# Patient Record
Sex: Male | Born: 1945 | Race: White | Hispanic: No | Marital: Married | Smoking: Former smoker
Health system: Southern US, Community
[De-identification: ages and names within clinical notes are randomized; demographics above are authoritative.]

## PROBLEM LIST (undated history)

## (undated) DIAGNOSIS — G4730 Sleep apnea, unspecified: Secondary | ICD-10-CM

## (undated) DIAGNOSIS — J449 Chronic obstructive pulmonary disease, unspecified: Secondary | ICD-10-CM

## (undated) DIAGNOSIS — G4700 Insomnia, unspecified: Secondary | ICD-10-CM

## (undated) DIAGNOSIS — E119 Type 2 diabetes mellitus without complications: Secondary | ICD-10-CM

## (undated) DIAGNOSIS — Z993 Dependence on wheelchair: Secondary | ICD-10-CM

## (undated) DIAGNOSIS — M549 Dorsalgia, unspecified: Secondary | ICD-10-CM

## (undated) DIAGNOSIS — M1612 Unilateral primary osteoarthritis, left hip: Secondary | ICD-10-CM

## (undated) DIAGNOSIS — F32A Depression, unspecified: Secondary | ICD-10-CM

## (undated) DIAGNOSIS — I10 Essential (primary) hypertension: Secondary | ICD-10-CM

## (undated) DIAGNOSIS — M4306 Spondylolysis, lumbar region: Secondary | ICD-10-CM

## (undated) DIAGNOSIS — E785 Hyperlipidemia, unspecified: Secondary | ICD-10-CM

## (undated) DIAGNOSIS — M4302 Spondylolysis, cervical region: Secondary | ICD-10-CM

## (undated) DIAGNOSIS — J302 Other seasonal allergic rhinitis: Secondary | ICD-10-CM

## (undated) HISTORY — PX: BLEPHAROPLASTY: SUR158

## (undated) HISTORY — PX: UPPER GI ENDOSCOPY: SHX6162

## (undated) HISTORY — PX: COLONOSCOPY: SHX174

---

## 2001-12-03 HISTORY — PX: CARDIAC CATHETERIZATION: SHX172

## 2006-12-03 HISTORY — PX: PALATE SURGERY: SHX729

## 2013-12-03 HISTORY — PX: JOINT REPLACEMENT: SHX530

## 2019-02-02 DIAGNOSIS — R0609 Other forms of dyspnea: Secondary | ICD-10-CM | POA: Insufficient documentation

## 2019-02-02 DIAGNOSIS — M47812 Spondylosis without myelopathy or radiculopathy, cervical region: Secondary | ICD-10-CM | POA: Insufficient documentation

## 2019-02-02 DIAGNOSIS — M47816 Spondylosis without myelopathy or radiculopathy, lumbar region: Secondary | ICD-10-CM | POA: Insufficient documentation

## 2019-02-02 DIAGNOSIS — M25569 Pain in unspecified knee: Secondary | ICD-10-CM | POA: Insufficient documentation

## 2019-02-02 DIAGNOSIS — M1991 Primary osteoarthritis, unspecified site: Secondary | ICD-10-CM | POA: Insufficient documentation

## 2019-02-02 DIAGNOSIS — M25559 Pain in unspecified hip: Secondary | ICD-10-CM | POA: Insufficient documentation

## 2019-02-02 DIAGNOSIS — M179 Osteoarthritis of knee, unspecified: Secondary | ICD-10-CM | POA: Insufficient documentation

## 2019-02-02 DIAGNOSIS — F32A Depression, unspecified: Secondary | ICD-10-CM | POA: Insufficient documentation

## 2019-02-02 DIAGNOSIS — R079 Chest pain, unspecified: Secondary | ICD-10-CM | POA: Insufficient documentation

## 2019-02-02 DIAGNOSIS — M545 Low back pain, unspecified: Secondary | ICD-10-CM | POA: Insufficient documentation

## 2019-02-02 DIAGNOSIS — M542 Cervicalgia: Secondary | ICD-10-CM | POA: Insufficient documentation

## 2019-02-02 DIAGNOSIS — M79643 Pain in unspecified hand: Secondary | ICD-10-CM | POA: Insufficient documentation

## 2019-02-02 DIAGNOSIS — M1711 Unilateral primary osteoarthritis, right knee: Secondary | ICD-10-CM | POA: Insufficient documentation

## 2019-02-26 DIAGNOSIS — G4733 Obstructive sleep apnea (adult) (pediatric): Secondary | ICD-10-CM | POA: Insufficient documentation

## 2019-05-28 ENCOUNTER — Other Ambulatory Visit: Payer: Self-pay

## 2019-05-28 DIAGNOSIS — R079 Chest pain, unspecified: Secondary | ICD-10-CM

## 2019-06-10 ENCOUNTER — Other Ambulatory Visit: Payer: Self-pay

## 2019-06-10 ENCOUNTER — Ambulatory Visit (HOSPITAL_COMMUNITY)
Admission: RE | Admit: 2019-06-10 | Discharge: 2019-06-10 | Disposition: A | Discharge status: Home / Self Care | Payer: BC Managed Care – PPO | Source: Ambulatory Visit | Attending: Cardiology | Admitting: Cardiology

## 2019-06-10 ENCOUNTER — Ambulatory Visit (HOSPITAL_COMMUNITY): Payer: BC Managed Care – PPO

## 2019-06-10 DIAGNOSIS — R0789 Other chest pain: Secondary | ICD-10-CM

## 2019-06-10 DIAGNOSIS — R079 Chest pain, unspecified: Secondary | ICD-10-CM

## 2019-06-10 LAB — POCT I-STAT CREATININE: Creatinine, Ser: 0.9 mg/dL (ref 0.61–1.24)

## 2019-06-10 MED ORDER — NITROGLYCERIN 0.4 MG SL SUBL
0.8000 mg | SUBLINGUAL_TABLET | SUBLINGUAL | Status: DC | PRN
  Administered 2019-06-10: 0.8 mg via SUBLINGUAL

## 2019-06-10 MED ORDER — METOPROLOL TARTRATE 5 MG/5ML IV SOLN
5.0000 mg | INTRAVENOUS | Status: DC | PRN
  Filled 2019-06-10: qty 5

## 2019-06-10 MED ORDER — IOHEXOL 350 MG/ML SOLN
100.0000 mL | Freq: Once | INTRAVENOUS | Status: AC | PRN
  Administered 2019-06-10: 16:00:00 100 mL via INTRAVENOUS

## 2019-06-10 MED ORDER — NITROGLYCERIN 0.4 MG SL SUBL
SUBLINGUAL_TABLET | SUBLINGUAL | Status: AC
  Filled 2019-06-10: qty 2

## 2019-07-16 DIAGNOSIS — M159 Polyosteoarthritis, unspecified: Secondary | ICD-10-CM | POA: Insufficient documentation

## 2019-07-16 DIAGNOSIS — Z125 Encounter for screening for malignant neoplasm of prostate: Secondary | ICD-10-CM | POA: Insufficient documentation

## 2019-07-16 DIAGNOSIS — I10 Essential (primary) hypertension: Secondary | ICD-10-CM | POA: Insufficient documentation

## 2019-07-16 DIAGNOSIS — E119 Type 2 diabetes mellitus without complications: Secondary | ICD-10-CM | POA: Insufficient documentation

## 2021-10-24 ENCOUNTER — Ambulatory Visit: Payer: Self-pay

## 2021-10-24 ENCOUNTER — Encounter: Payer: Self-pay | Admitting: Orthopaedic Surgery

## 2021-10-24 ENCOUNTER — Ambulatory Visit: Payer: BC Managed Care – PPO | Admitting: Orthopaedic Surgery

## 2021-10-24 ENCOUNTER — Other Ambulatory Visit: Payer: Self-pay

## 2021-10-24 VITALS — Ht 66.0 in | Wt 247.8 lb

## 2021-10-24 DIAGNOSIS — M160 Bilateral primary osteoarthritis of hip: Secondary | ICD-10-CM | POA: Diagnosis not present

## 2021-10-24 DIAGNOSIS — M25552 Pain in left hip: Secondary | ICD-10-CM | POA: Diagnosis not present

## 2021-10-24 NOTE — Progress Notes (Signed)
Office Visit Note   Patient: Kent Rodriguez           Date of Birth: 1946/05/24           MRN: [id number] Visit Date: 10/24/2021              Requested by: Silverio Decamp, MD [address] [place],  [place] [address] PCP: Silverio Decamp, MD   Assessment & Plan: Visit Diagnoses:  1. Bilateral primary osteoarthritis of hip     Plan: Impression is advanced degenerative joint disease both hips left greater than right.  The patient has been dealing with this pain for several years and has failed conservative management to include intra-articular cortisone injections and narcotic pain medication.  He would like to proceed with total hip arthroplasty and would like the left hip replaced first.  Risk, benefits and alternatives reviewed.  Rehab recovery time discussed.  All questions were answered.  His last hemoglobin A1c was 6.4 5 months ago so we will go ahead and obtain a new 1 today.  Will obtain CT scan for preoperative planning.  Will have patient follow up after CT scan.  I encouraged him to continue all efforts at weight loss to help with postoperative rehab and decrease perioperative risk.    Follow-Up Instructions: Return for after left hip ct scan.   Orders:  Orders Placed This Encounter  Procedures   XR HIP UNILAT W OR W/O PELVIS 2-3 VIEWS LEFT   XR HIP UNILAT W OR W/O PELVIS 2-3 VIEWS RIGHT   No orders of the defined types were placed in this encounter.     Procedures: No procedures performed   Clinical Data: No additional findings.   Subjective: Chief Complaint  Patient presents with   Right Hip - Pain   Left Hip - Pain    HPI patient is a pleasant 75 year old gentleman who comes in today with bilateral hip pain left greater than right.  This is been ongoing for the past 3 to 4 years.  The pain is to the groin and occasionally radiates into the knee.  Pain is worse with ambulation.  He has been taking morphine which she gets from pain management.   He has had a few cortisone injections in the past which no longer relieves his symptoms.  He has been ambulating in an electric wheelchair for the past year due to the significant pain.  Review of Systems as detailed in HPI.  All others reviewed and are negative.   Objective: Vital Signs: Ht 5\' 6"  (1.676 m)   Wt 247 lb 12.8 oz (112.4 kg)   BMI 40.00 kg/m   Physical Exam well-developed well-nourished gentleman in no acute distress.  Alert and oriented x3.  Ortho Exam bilateral hip exam shows markedly positive logroll both sides.  Positive FADIR.  He is neurovascular intact distally.  He is able to stand and transfer on his legs but cannot take more than 2 steps due to severe pain.    Specialty Comments:  No specialty comments available.  Imaging: XR HIP UNILAT W OR W/O PELVIS 2-3 VIEWS LEFT  Result Date: 10/24/2021 X-rays demonstrate significant joint space narrowing with questionable protrusio.  XR HIP UNILAT W OR W/O PELVIS 2-3 VIEWS RIGHT  Result Date: 10/24/2021 X-rays demonstrate significant joint space narrowing    PMFS History: There are no problems to display for this patient.  History reviewed. No pertinent past medical history.  History reviewed. No pertinent family history.  History  reviewed. No pertinent surgical history. Social History   Occupational History   Not on file  Tobacco Use   Smoking status: Not on file   Smokeless tobacco: Not on file  Substance and Sexual Activity   Alcohol use: Not on file   Drug use: Not on file   Sexual activity: Not on file

## 2021-10-25 LAB — PREALBUMIN: Prealbumin: 26 mg/dL (ref 21–43)

## 2021-10-25 LAB — HEMOGLOBIN A1C
Hgb A1c MFr Bld: 5.7 % of total Hgb — ABNORMAL HIGH (ref <5.7)
Mean Plasma Glucose: 117 mg/dL
eAG (mmol/L): 6.5 mmol/L

## 2021-11-06 DIAGNOSIS — E7800 Pure hypercholesterolemia, unspecified: Secondary | ICD-10-CM | POA: Insufficient documentation

## 2021-11-06 DIAGNOSIS — G4700 Insomnia, unspecified: Secondary | ICD-10-CM | POA: Insufficient documentation

## 2021-11-16 ENCOUNTER — Ambulatory Visit
Admission: RE | Admit: 2021-11-16 | Discharge: 2021-11-16 | Disposition: A | Discharge status: Home / Self Care | Payer: BC Managed Care – PPO | Source: Ambulatory Visit | Attending: Orthopaedic Surgery | Admitting: Orthopaedic Surgery

## 2021-11-16 DIAGNOSIS — M25552 Pain in left hip: Secondary | ICD-10-CM

## 2021-11-21 ENCOUNTER — Other Ambulatory Visit: Payer: Self-pay

## 2021-11-21 ENCOUNTER — Ambulatory Visit: Payer: BC Managed Care – PPO | Admitting: Orthopaedic Surgery

## 2021-11-21 DIAGNOSIS — M1612 Unilateral primary osteoarthritis, left hip: Secondary | ICD-10-CM

## 2021-11-21 NOTE — Progress Notes (Signed)
° °  Office Visit Note   Patient: Kent Rodriguez           Date of Birth: [birth year]           MRN: [id number] Visit Date: 11/21/2021              Requested by: Gaspar Skeeters, MD [id number] [place],  [place] [address] PCP: Gaspar Skeeters, MD   Assessment & Plan: Visit Diagnoses:  1. Primary osteoarthritis of left hip     Plan: Patient returns today for review of left hip CT scan.  This was done to evaluate the severity of his DJD.  Left hip exam is unchanged.  CT scan reviewed with the patient and we went over details of the surgery and the associated risk benefits rehab recovery.  At this point we will go ahead and have Debbie call the patient in the near future to arrange surgery.  We will plan to place an incisional VAC due to overhanging pannus.  Follow-Up Instructions: No follow-ups on file.   Orders:  No orders of the defined types were placed in this encounter.  No orders of the defined types were placed in this encounter.     Procedures: No procedures performed   Clinical Data: No additional findings.   Subjective: Chief Complaint  Patient presents with   Left Hip - Follow-up    HPI  Review of Systems   Objective: Vital Signs: There were no vitals taken for this visit.  Physical Exam  Ortho Exam  Specialty Comments:  No specialty comments available.  Imaging: No results found.   PMFS History: Patient Active Problem List   Diagnosis Date Noted   High blood cholesterol 11/06/2021   Insomnia 11/06/2021   Diabetes mellitus without complication (HCC) 07/16/2019   Essential hypertension 07/16/2019   Primary osteoarthritis involving multiple joints 07/16/2019   Screening for prostate cancer 07/16/2019   Obstructive sleep apnea (adult) (pediatric) 02/26/2019   Morbid (severe) obesity due to excess calories (HCC) 02/26/2019   Cervical spondylosis 02/02/2019   Lumbar spondylosis 02/02/2019   Depression, unspecified 02/02/2019    Dyspnea on exertion 02/02/2019   Hand pain 02/02/2019   Chest pain with high risk for cardiac etiology 02/02/2019   Hip pain 02/02/2019   Knee pain 02/02/2019   Localized, primary osteoarthritis 02/02/2019   Low back pain 02/02/2019   Neck pain 02/02/2019   Osteoarthritis of knee 02/02/2019   No past medical history on file.  No family history on file.  No past surgical history on file. Social History   Occupational History   Not on file  Tobacco Use   Smoking status: Not on file   Smokeless tobacco: Not on file  Substance and Sexual Activity   Alcohol use: Not on file   Drug use: Not on file   Sexual activity: Not on file

## 2021-12-10 ENCOUNTER — Encounter: Payer: Self-pay | Admitting: Orthopaedic Surgery

## 2021-12-22 ENCOUNTER — Other Ambulatory Visit: Payer: Self-pay

## 2022-01-12 ENCOUNTER — Encounter: Payer: Self-pay | Admitting: Orthopaedic Surgery

## 2022-01-15 ENCOUNTER — Other Ambulatory Visit: Payer: Self-pay | Admitting: Physician Assistant

## 2022-01-15 MED ORDER — METHOCARBAMOL 500 MG PO TABS: 500.0000 mg | ORAL_TABLET | Freq: Two times a day (BID) | ORAL | 0 refills | Status: DC | PRN

## 2022-01-15 MED ORDER — DOCUSATE SODIUM 100 MG PO CAPS: 100.0000 mg | ORAL_CAPSULE | Freq: Every day | ORAL | 2 refills | Status: AC | PRN

## 2022-01-15 MED ORDER — SULFAMETHOXAZOLE-TRIMETHOPRIM 800-160 MG PO TABS: 1.0000 | ORAL_TABLET | Freq: Two times a day (BID) | ORAL | 0 refills | Status: DC

## 2022-01-15 MED ORDER — ASPIRIN EC 81 MG PO TBEC: 81.0000 mg | DELAYED_RELEASE_TABLET | Freq: Two times a day (BID) | ORAL | 0 refills | Status: DC

## 2022-01-15 MED ORDER — ONDANSETRON HCL 4 MG PO TABS: 4.0000 mg | ORAL_TABLET | Freq: Three times a day (TID) | ORAL | 0 refills | Status: DC | PRN

## 2022-01-18 NOTE — Pre-Procedure Instructions (Signed)
Surgical Instructions    Your procedure is scheduled on Monday, February 20th.  Report to [facility] Main Entrance "A" at 12:15 P.M., then check in with the Admitting office.  Call this number if you have problems the morning of surgery:  [phone]   If you have any questions prior to your surgery date call [phone]: Open Monday-Friday 8am-4pm    Remember:  Do not eat after midnight the night before your surgery  You may drink clear liquids until 11:15 AM the morning of your surgery.   Clear liquids allowed are: Water, Non-Citrus Juices (without pulp), Carbonated Beverages, Clear Tea, Black Coffee Only (NO MILK, CREAM OR POWDERED CREAMER of any kind), and Gatorade.   Patient Instructions  The night before surgery:  No food after midnight. ONLY clear liquids after midnight   The day of surgery (if you have diabetes): Drink ONE (1) 12 oz G2 given to you in your pre admission testing appointment by 11:15 AM the morning of surgery. Drink in one sitting. Do not sip.  This drink was given to you during your hospital  pre-op appointment visit.  Nothing else to drink after completing the  12 oz bottle of G2.         If you have questions, please contact your surgeons office.      Take these medicines the morning of surgery with A SIP OF WATER  metoprolol succinate (TOPROL-XL)  morphine (MS CONTIN)    If needed: fluticasone (FLONASE)  methocarbamol (ROBAXIN) morphine (MSIR) ondansetron (ZOFRAN)     As of today, STOP taking any Aspirin (unless otherwise instructed by your surgeon) Aleve, Naproxen, Ibuprofen, Motrin, Advil, Goody's, BC's, all herbal medications, fish oil, and all vitamins. This includes your meloxicam (MOBIC).    WHAT DO I DO ABOUT MY DIABETES MEDICATION?   Do not take metFORMIN (GLUCOPHAGE-XR) the morning of surgery.     HOW TO MANAGE YOUR DIABETES BEFORE AND AFTER SURGERY  Why is it important to control my blood sugar before and after  surgery? Improving blood sugar levels before and after surgery helps healing and can limit problems. A way of improving blood sugar control is eating a healthy diet by:  Eating less sugar and carbohydrates  Increasing activity/exercise  Talking with your doctor about reaching your blood sugar goals High blood sugars (greater than 180 mg/dL) can raise your risk of infections and slow your recovery, so you will need to focus on controlling your diabetes during the weeks before surgery. Make sure that the doctor who takes care of your diabetes knows about your planned surgery including the date and location.  How do I manage my blood sugar before surgery? Check your blood sugar at least 4 times a day, starting 2 days before surgery, to make sure that the level is not too high or low.  Check your blood sugar the morning of your surgery when you wake up and every 2 hours until you get to the Short Stay unit.  If your blood sugar is less than 70 mg/dL, you will need to treat for low blood sugar: Do not take insulin. Treat a low blood sugar (less than 70 mg/dL) with  cup of clear juice (cranberry or apple), 4 glucose tablets, OR glucose gel. Recheck blood sugar in 15 minutes after treatment (to make sure it is greater than 70 mg/dL). If your blood sugar is not greater than 70 mg/dL on recheck, call [phone] for further instructions. Report your blood sugar to the short  stay nurse when you get to Short Stay.  If you are admitted to the hospital after surgery: Your blood sugar will be checked by the staff and you will probably be given insulin after surgery (instead of oral diabetes medicines) to make sure you have good blood sugar levels. The goal for blood sugar control after surgery is 80-180 mg/dL.                     Do NOT Smoke (Tobacco/Vaping) for 24 hours prior to your procedure.  If you use a CPAP at night, you may bring your mask/headgear for your overnight stay.   Contacts,  glasses, piercing's, hearing aid's, dentures or partials may not be worn into surgery, please bring cases for these belongings.    For patients admitted to the hospital, discharge time will be determined by your treatment team.   Patients discharged the day of surgery will not be allowed to drive home, and someone needs to stay with them for 24 hours.  NO VISITORS WILL BE ALLOWED IN PRE-OP WHERE PATIENTS ARE PREPPED FOR SURGERY.  ONLY 1 SUPPORT PERSON MAY BE PRESENT IN THE WAITING ROOM WHILE YOU ARE IN SURGERY.  IF YOU ARE TO BE ADMITTED, ONCE YOU ARE IN YOUR ROOM YOU WILL BE ALLOWED TWO (2) VISITORS. (1) VISITOR MAY STAY OVERNIGHT BUT MUST ARRIVE TO THE ROOM BY 8pm.  Minor children may have two parents present. Special consideration for safety and communication needs will be reviewed on a case by case basis.   Special instructions:   - Preparing For Surgery  Before surgery, you can play an important role. Because skin is not sterile, your skin needs to be as free of germs as possible. You can reduce the number of germs on your skin by washing with CHG (chlorahexidine gluconate) Soap before surgery.  CHG is an antiseptic cleaner which kills germs and bonds with the skin to continue killing germs even after washing.    Oral Hygiene is also important to reduce your risk of infection.  Remember - BRUSH YOUR TEETH THE MORNING OF SURGERY WITH YOUR REGULAR TOOTHPASTE  Please do not use if you have an allergy to CHG or antibacterial soaps. If your skin becomes reddened/irritated stop using the CHG.  Do not shave (including legs and underarms) for at least 48 hours prior to first CHG shower. It is OK to shave your face.  Please follow these instructions carefully.   Shower the NIGHT BEFORE SURGERY and the MORNING OF SURGERY  If you chose to wash your hair, wash your hair first as usual with your normal shampoo.  After you shampoo, rinse your hair and body thoroughly to remove the  shampoo.  Use CHG Soap as you would any other liquid soap. You can apply CHG directly to the skin and wash gently with a scrungie or a clean washcloth.   Apply the CHG Soap to your body ONLY FROM THE NECK DOWN.  Do not use on open wounds or open sores. Avoid contact with your eyes, ears, mouth and genitals (private parts). Wash Face and genitals (private parts)  with your normal soap.   Wash thoroughly, paying special attention to the area where your surgery will be performed.  Thoroughly rinse your body with warm water from the neck down.  DO NOT shower/wash with your normal soap after using and rinsing off the CHG Soap.  Pat yourself dry with a CLEAN TOWEL.  Wear CLEAN PAJAMAS to bed  the night before surgery  Place CLEAN SHEETS on your bed the night before your surgery  DO NOT SLEEP WITH PETS.   Day of Surgery: Shower with CHG soap. Do not wear jewelry Do not wear lotions, powders, colognes, or deodorant. Do not shave 48 hours prior to surgery.  Men may shave face and neck. Do not bring valuables to the hospital. [facility] is not responsible for any belongings or valuables. Wear Clean/Comfortable clothing the morning of surgery Remember to brush your teeth WITH YOUR REGULAR TOOTHPASTE.   Please read over the following fact sheets that you were given.   3 days prior to your procedure or After your COVID test   You are not required to quarantine however you are required to wear a well-fitting mask when you are out and around people not in your household. If your mask becomes wet or soiled, replace with a new one.   Wash your hands often with soap and water for 20 seconds or clean your hands with an alcohol-based hand sanitizer that contains at least 60% alcohol.   Do not share personal items.   Notify your provider:  o if you are in close contact with someone who has COVID  o or if you develop a fever of 100.4 or greater, sneezing, cough, sore throat, shortness of  breath or body aches.

## 2022-01-19 ENCOUNTER — Encounter (HOSPITAL_COMMUNITY)
Admission: RE | Admit: 2022-01-19 | Discharge: 2022-01-19 | Disposition: A | Discharge status: Home / Self Care | Payer: BC Managed Care – PPO | Source: Ambulatory Visit | Attending: Orthopaedic Surgery | Admitting: Orthopaedic Surgery

## 2022-01-19 ENCOUNTER — Encounter (HOSPITAL_COMMUNITY): Payer: Self-pay

## 2022-01-19 ENCOUNTER — Other Ambulatory Visit: Payer: Self-pay

## 2022-01-19 VITALS — BP 130/77 | HR 62 | Temp 98.7°F | Resp 18 | Ht 66.0 in | Wt 240.0 lb

## 2022-01-19 DIAGNOSIS — I2510 Atherosclerotic heart disease of native coronary artery without angina pectoris: Secondary | ICD-10-CM | POA: Diagnosis not present

## 2022-01-19 DIAGNOSIS — M1612 Unilateral primary osteoarthritis, left hip: Secondary | ICD-10-CM | POA: Insufficient documentation

## 2022-01-19 DIAGNOSIS — G4733 Obstructive sleep apnea (adult) (pediatric): Secondary | ICD-10-CM | POA: Diagnosis not present

## 2022-01-19 DIAGNOSIS — J449 Chronic obstructive pulmonary disease, unspecified: Secondary | ICD-10-CM | POA: Insufficient documentation

## 2022-01-19 DIAGNOSIS — Z20822 Contact with and (suspected) exposure to covid-19: Secondary | ICD-10-CM | POA: Insufficient documentation

## 2022-01-19 DIAGNOSIS — E119 Type 2 diabetes mellitus without complications: Secondary | ICD-10-CM | POA: Diagnosis not present

## 2022-01-19 DIAGNOSIS — M545 Low back pain, unspecified: Secondary | ICD-10-CM | POA: Diagnosis not present

## 2022-01-19 DIAGNOSIS — E785 Hyperlipidemia, unspecified: Secondary | ICD-10-CM | POA: Diagnosis not present

## 2022-01-19 DIAGNOSIS — G4700 Insomnia, unspecified: Secondary | ICD-10-CM | POA: Diagnosis not present

## 2022-01-19 DIAGNOSIS — I10 Essential (primary) hypertension: Secondary | ICD-10-CM | POA: Diagnosis not present

## 2022-01-19 DIAGNOSIS — Z9989 Dependence on other enabling machines and devices: Secondary | ICD-10-CM | POA: Insufficient documentation

## 2022-01-19 DIAGNOSIS — Z87891 Personal history of nicotine dependence: Secondary | ICD-10-CM | POA: Insufficient documentation

## 2022-01-19 DIAGNOSIS — Z01818 Encounter for other preprocedural examination: Secondary | ICD-10-CM | POA: Insufficient documentation

## 2022-01-19 DIAGNOSIS — Z993 Dependence on wheelchair: Secondary | ICD-10-CM | POA: Insufficient documentation

## 2022-01-19 HISTORY — DX: Essential (primary) hypertension: I10

## 2022-01-19 HISTORY — DX: Chronic obstructive pulmonary disease, unspecified: J44.9

## 2022-01-19 HISTORY — DX: Dependence on wheelchair: Z99.3

## 2022-01-19 HISTORY — DX: Dorsalgia, unspecified: M54.9

## 2022-01-19 HISTORY — DX: Spondylolysis, lumbar region: M43.06

## 2022-01-19 HISTORY — DX: Type 2 diabetes mellitus without complications: E11.9

## 2022-01-19 HISTORY — DX: Other seasonal allergic rhinitis: J30.2

## 2022-01-19 HISTORY — DX: Hyperlipidemia, unspecified: E78.5

## 2022-01-19 HISTORY — DX: Sleep apnea, unspecified: G47.30

## 2022-01-19 HISTORY — DX: Unilateral primary osteoarthritis, left hip: M16.12

## 2022-01-19 HISTORY — DX: Depression, unspecified: F32.A

## 2022-01-19 HISTORY — DX: Spondylolysis, cervical region: M43.02

## 2022-01-19 HISTORY — DX: Insomnia, unspecified: G47.00

## 2022-01-19 LAB — SURGICAL PCR SCREEN
MRSA, PCR: NEGATIVE
Staphylococcus aureus: NEGATIVE

## 2022-01-19 LAB — SARS CORONAVIRUS 2 (TAT 6-24 HRS): SARS Coronavirus 2: NEGATIVE

## 2022-01-19 LAB — BASIC METABOLIC PANEL
Anion gap: 8 (ref 5–15)
BUN: 29 mg/dL — ABNORMAL HIGH (ref 8–23)
CO2: 29 mmol/L (ref 22–32)
Calcium: 9.4 mg/dL (ref 8.9–10.3)
Chloride: 103 mmol/L (ref 98–111)
Creatinine, Ser: 1 mg/dL (ref 0.61–1.24)
GFR, Estimated: >60 mL/min (ref >60)
Glucose, Bld: 120 mg/dL — ABNORMAL HIGH (ref 70–99)
Potassium: 4.1 mmol/L (ref 3.5–5.1)
Sodium: 140 mmol/L (ref 135–145)

## 2022-01-19 LAB — CBC
HCT: 45.3 % (ref 39.0–52.0)
Hemoglobin: 15.2 g/dL (ref 13.0–17.0)
MCH: 31.2 pg (ref 26.0–34.0)
MCHC: 33.6 g/dL (ref 30.0–36.0)
MCV: 93 fL (ref 80.0–100.0)
Platelets: 258 10*3/uL (ref 150–400)
RBC: 4.87 MIL/uL (ref 4.22–5.81)
RDW: 13.2 % (ref 11.5–15.5)
WBC: 6.9 10*3/uL (ref 4.0–10.5)
nRBC: 0 % (ref 0.0–0.2)

## 2022-01-19 LAB — GLUCOSE, CAPILLARY: Glucose-Capillary: 116 mg/dL — ABNORMAL HIGH (ref 70–99)

## 2022-01-19 MED ORDER — TRANEXAMIC ACID 1000 MG/10ML IV SOLN
2000.0000 mg | INTRAVENOUS | Status: DC
  Filled 2022-01-19: qty 20

## 2022-01-19 NOTE — Anesthesia Preprocedure Evaluation (Addendum)
Anesthesia Evaluation  Patient identified by MRN, date of birth, ID band Patient awake    Reviewed: Allergy & Precautions, NPO status , Patient's Chart, lab work & pertinent test results  Airway Mallampati: II  TM Distance: >3 FB Neck ROM: Full    Dental no notable dental hx.    Pulmonary sleep apnea and Continuous Positive Airway Pressure Ventilation , COPD, former smoker,    Pulmonary exam normal breath sounds clear to auscultation       Cardiovascular hypertension, Pt. on medications Normal cardiovascular exam Rhythm:Regular Rate:Normal  ECG: NSR, rate 60   Neuro/Psych PSYCHIATRIC DISORDERS Depression negative neurological ROS     GI/Hepatic negative GI ROS, (+)     substance abuse  ,   Endo/Other  diabetes, Oral Hypoglycemic Agents  Renal/GU negative Renal ROS     Musculoskeletal  (+) Arthritis , narcotic dependentWheelchair bound   Abdominal (+) + obese,   Peds  Hematology negative hematology ROS (+)   Anesthesia Other Findings LEFT HIP DEGENERATIVE JOINT DISEASE  Reproductive/Obstetrics                          Anesthesia Physical Anesthesia Plan  ASA: 3  Anesthesia Plan: Spinal   Post-op Pain Management:    Induction: Intravenous  PONV Risk Score and Plan: 1 and Ondansetron, Dexamethasone, Midazolam, Propofol infusion and Treatment may vary due to age or medical condition  Airway Management Planned: Simple Face Mask  Additional Equipment:   Intra-op Plan:   Post-operative Plan:   Informed Consent: I have reviewed the patients History and Physical, chart, labs and discussed the procedure including the risks, benefits and alternatives for the proposed anesthesia with the patient or authorized representative who has indicated his/her understanding and acceptance.     Dental advisory given  Plan Discussed with: CRNA  Anesthesia Plan Comments: (Reviewed PAT note  written 01/19/2022 by Shonna Chock, PA-C. )       Anesthesia Quick Evaluation

## 2022-01-19 NOTE — Progress Notes (Signed)
DUE TO COVID-19 ONLY ONE VISITOR IS ALLOWED TO COME WITH YOU AND STAY IN THE WAITING ROOM ONLY DURING PRE OP AND PROCEDURE DAY OF SURGERY.   Two VISITORS MAY VISIT WITH YOU AFTER SURGERY IN YOUR PRIVATE ROOM DURING VISITING HOURS ONLY!  PCP - Dr Gaspar Skeeters Cardiologist - n/a  Chest x-ray - n/a EKG - 01/19/22 Stress Test - n/a ECHO - 02/19/19 Cardiac Cath - n/a  ICD Pacemaker/Loop - n/a  Sleep Study -  Yes CPAP - uses CPAP nightly  Do not take Metformin on the morning of surgery.  If your blood sugar is less than 70 mg/dL, you will need to treat for low blood sugar: Treat a low blood sugar (less than 70 mg/dL) with  cup of clear juice (cranberry or apple), 4 glucose tablets, OR glucose gel. Recheck blood sugar in 15 minutes after treatment (to make sure it is greater than 70 mg/dL). If your blood sugar is not greater than 70 mg/dL on recheck, call [phone] for further instructions.  Aspirin Instructions: Aspirin was prescribed for after surgery.  Patient does not currently taking ASA.  ERAS: Clear liquids til 11:15 AM.  Complete G2 drink by 11:15 AM DOS.  Do not drink anything else after G2 drink on DOS.  Detail instructions on patient's instructions for DOS.  Anesthesia review: Yes  STOP now taking any Aspirin (unless otherwise instructed by your surgeon), Aleve, Naproxen, Ibuprofen, Motrin, Advil, Goody's, BC's, all herbal medications, fish oil, and all vitamins.   Coronavirus Screening Covid test is scheduled on 01/19/22 at PAT appt. Do you have any of the following symptoms:  Cough yes/no: No Fever (>100.58F)  yes/no: No Runny nose yes/no: No Sore throat yes/no: No Difficulty breathing/shortness of breath  yes/no: No  Have you traveled in the last 14 days and where? yes/no: No  Patient verbalized understanding of instructions that were given to them at the PAT appointment. Patient was also instructed that they will need to review over the PAT instructions again at  home before surgery.

## 2022-01-19 NOTE — Progress Notes (Signed)
Anesthesia Chart Review:  Case: [id number] Date/Time: 01/22/22 1155   Procedure: LEFT TOTAL HIP ARTHROPLASTY ANTERIOR APPROACH (Left: Hip) - 3-C   Anesthesia type: Spinal   Pre-op diagnosis: LEFT HIP DEGENERATIVE JOINT DISEASE   Location: [facility] OR ROOM 05 / [place] OR   Surgeons: Leandrew Koyanagi, MD       DISCUSSION: Patient is a 76 year old male scheduled for the above procedure.  History includes former smoker (quit 08/24/91), HTN, HLD, DM2, OSA (uses CPAP), COPD, low back pain (uses wheelchair), insomnia. 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  He had preoperative evaluation on 01/16/22 by Leone Haven, MD (see [place]). Note states, "May proceed with surgery as planned."  01/19/2022 presurgical COVID-19 test in process.  Anesthesia team to evaluate on the day of surgery.   VS: BP 130/77    Pulse 62    Temp 37.1 C (Oral)    Resp 18    Ht 5\' 6"  (1.676 m)    Wt 108.9 kg    SpO2 98%    BMI 38.74 kg/m    PROVIDERS: Leone Haven, MD is PCP  - He was evaluated by cardiologist Isaias Cowman, MD in 2020 for chest pain and SOB. 02/2019 echo showed normal LVF, EF > 55%, mild LVH, normal RVSF, mild-moderate TR, mild MR. CCTA showed coronary calcium score of 10, mild CAD with short intramyocardial bridge in the mid LAD.   LABS: Labs reviewed: Acceptable for surgery. A1c 6.4% on 11/15/21 ([facility]) (all labs ordered are listed, but only abnormal results are displayed)  Labs Reviewed  GLUCOSE, CAPILLARY - Abnormal; Notable for the following components:      Result Value   Glucose-Capillary 116 (*)    All other components within normal limits  BASIC METABOLIC PANEL - Abnormal; Notable for the following components:   Glucose, Bld 120 (*)    BUN 29 (*)    All other components within normal limits  SURGICAL PCR SCREEN  SARS CORONAVIRUS 2 (TAT 6-24 HRS)  CBC     IMAGES: CT Left Hip 11/16/21: IMPRESSION: 1. No acute osseous injury of the left hip. 2.  Severe osteoarthritis of the left hip. 3. Mild osteoarthritis of bilateral SI joints. 4. Partially visualized, lower lumbar spine spondylosis.   EKG: 01/19/22: Normal sinus rhythm Low voltage QRS Cannot rule out Anterior infarct , age undetermined Abnormal ECG   CV: CT coronary 06/10/19: - Aorta:  Normal size.  No calcifications.  No dissection. - Aortic Valve:  Trileaflet.  Trivial calcifications. - Coronary Arteries:  Normal coronary origin. Left dominance. -  Left main is a large artery that gives rise to LAD and LCX arteries. Left main has no plaque. - LAD is a large vessel that has one diagonal artery and no plaque. Mid LAD has a short intramyocardial bridge. - LCX is a large dominant artery that gives rise to one large OM1 branch, PDA and PLA. There is mild calcified plaque with stenosis 25-49%. - RCA is a small non-dominant artery. - Other findings: Normal pulmonary vein drainage into the left atrium. Normal left atrial appendage without a thrombus.  IMPRESSION: 1. Coronary calcium score of 10. This was 36 percentile for age and sex matched control. 2. Normal coronary origin with right dominance. 3. Mild non-obstructive CAD. There is a short intramyocardial bridge in the mid LAD. 4. 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    Echo 02/19/19 ([facility]): INTERPRETATION  NORMAL LEFT VENTRICULAR SYSTOLIC FUNCTION WITH AN ESTIMATED EF = >  55 %  NORMAL RIGHT VENTRICULAR SYSTOLIC FUNCTION  MILD-TO-MODERATE TRICUSPID VALVE INSUFFICIENCY  MILD MITRAL VALVE INSUFFICIENCY  NO VALVULAR STENOSIS  MILD LVH    Per [facility] Cardiology notes, "The patient has a history of prior cardiac catheterization in 2003 at the [facility] in [place], which apparently revealed insignificant coronary artery disease."   Past Medical History:  Diagnosis Date   Back pain    lower back   COPD (chronic obstructive pulmonary disease) ([place])    Dependent on wheelchair     Depression    Diabetes mellitus without complication (HCC)    type 2   HLD (hyperlipidemia)    Hypertension    Insomnia    Osteoarthritis of left hip    Seasonal allergies    Sleep apnea    uses CPAP nightly   Spondylolysis of cervical region    Spondylolysis of lumbar region     Past Surgical History:  Procedure Laterality Date   COLONOSCOPY     several x 3   JOINT REPLACEMENT  2015   left knee   PALATE SURGERY  2008   benign   UPPER GI ENDOSCOPY     several - stomach ulcers    MEDICATIONS:  aspirin EC 81 MG tablet   docusate sodium (COLACE) 100 MG capsule   methocarbamol (ROBAXIN) 500 MG tablet   ondansetron (ZOFRAN) 4 MG tablet   sulfamethoxazole-trimethoprim (BACTRIM DS) 800-160 MG tablet   cholecalciferol (VITAMIN D3) 25 MCG (1000 UNIT) tablet   fluticasone (FLONASE) 50 MCG/ACT nasal spray   lisinopril-hydrochlorothiazide (ZESTORETIC) 10-12.5 MG tablet   meloxicam (MOBIC) 15 MG tablet   metFORMIN (GLUCOPHAGE-XR) 500 MG 24 hr tablet   metoprolol succinate (TOPROL-XL) 50 MG 24 hr tablet   morphine (MS CONTIN) 30 MG 12 hr tablet   morphine (MSIR) 15 MG tablet   naloxone (NARCAN) nasal spray 4 mg/0.1 mL   nystatin-triamcinolone (MYCOLOG II) cream   Omega-3 Fatty Acids (FISH OIL) 1200 MG CAPS   simvastatin (ZOCOR) 20 MG tablet   traZODone (DESYREL) 50 MG tablet   zolpidem (AMBIEN) 10 MG tablet   No current facility-administered medications for this encounter.  Advised to follow surgeon recommendations regarding ASA.   Myra Gianotti, PA-C Surgical Short Stay/Anesthesiology [facility] Phone [phone] [facility] Phone [phone] 01/19/2022 1:36 PM

## 2022-01-22 ENCOUNTER — Encounter (HOSPITAL_COMMUNITY): Payer: Self-pay | Admitting: Orthopaedic Surgery

## 2022-01-22 ENCOUNTER — Ambulatory Visit (HOSPITAL_COMMUNITY): Payer: BC Managed Care – PPO

## 2022-01-22 ENCOUNTER — Other Ambulatory Visit: Payer: Self-pay

## 2022-01-22 ENCOUNTER — Ambulatory Visit (HOSPITAL_COMMUNITY): Payer: BC Managed Care – PPO | Admitting: Anesthesiology

## 2022-01-22 ENCOUNTER — Observation Stay (HOSPITAL_COMMUNITY): Payer: BC Managed Care – PPO

## 2022-01-22 ENCOUNTER — Encounter (HOSPITAL_COMMUNITY)
Admission: RE | Disposition: A | Discharge status: Home / Self Care | Payer: Self-pay | Source: Ambulatory Visit | Attending: Orthopaedic Surgery

## 2022-01-22 ENCOUNTER — Observation Stay (HOSPITAL_COMMUNITY)
Admission: RE | Admit: 2022-01-22 | Discharge: 2022-01-24 | Disposition: A | Discharge status: Home / Self Care | Payer: BC Managed Care – PPO | Source: Ambulatory Visit | Attending: Orthopaedic Surgery | Admitting: Orthopaedic Surgery

## 2022-01-22 ENCOUNTER — Ambulatory Visit (HOSPITAL_COMMUNITY): Payer: BC Managed Care – PPO | Admitting: Vascular Surgery

## 2022-01-22 DIAGNOSIS — I10 Essential (primary) hypertension: Secondary | ICD-10-CM | POA: Diagnosis not present

## 2022-01-22 DIAGNOSIS — Z79899 Other long term (current) drug therapy: Secondary | ICD-10-CM | POA: Insufficient documentation

## 2022-01-22 DIAGNOSIS — Z7984 Long term (current) use of oral hypoglycemic drugs: Secondary | ICD-10-CM | POA: Insufficient documentation

## 2022-01-22 DIAGNOSIS — J449 Chronic obstructive pulmonary disease, unspecified: Secondary | ICD-10-CM | POA: Diagnosis not present

## 2022-01-22 DIAGNOSIS — E119 Type 2 diabetes mellitus without complications: Secondary | ICD-10-CM | POA: Insufficient documentation

## 2022-01-22 DIAGNOSIS — Z96642 Presence of left artificial hip joint: Secondary | ICD-10-CM

## 2022-01-22 DIAGNOSIS — Z96649 Presence of unspecified artificial hip joint: Secondary | ICD-10-CM

## 2022-01-22 DIAGNOSIS — Z7982 Long term (current) use of aspirin: Secondary | ICD-10-CM | POA: Insufficient documentation

## 2022-01-22 DIAGNOSIS — Z419 Encounter for procedure for purposes other than remedying health state, unspecified: Secondary | ICD-10-CM

## 2022-01-22 DIAGNOSIS — M1612 Unilateral primary osteoarthritis, left hip: Secondary | ICD-10-CM | POA: Diagnosis present

## 2022-01-22 DIAGNOSIS — Z87891 Personal history of nicotine dependence: Secondary | ICD-10-CM | POA: Insufficient documentation

## 2022-01-22 HISTORY — PX: TOTAL HIP ARTHROPLASTY: SHX124

## 2022-01-22 LAB — GLUCOSE, CAPILLARY
Glucose-Capillary: 101 mg/dL — ABNORMAL HIGH (ref 70–99)
Glucose-Capillary: 119 mg/dL — ABNORMAL HIGH (ref 70–99)
Glucose-Capillary: 135 mg/dL — ABNORMAL HIGH (ref 70–99)

## 2022-01-22 LAB — TYPE AND SCREEN
ABO/RH(D): AB POS
Antibody Screen: NEGATIVE

## 2022-01-22 LAB — ABO/RH: ABO/RH(D): AB POS

## 2022-01-22 SURGERY — ARTHROPLASTY, HIP, TOTAL, ANTERIOR APPROACH
Anesthesia: Spinal | Site: Hip | Laterality: Left

## 2022-01-22 MED ORDER — FENTANYL CITRATE (PF) 100 MCG/2ML IJ SOLN
25.0000 ug | INTRAMUSCULAR | Status: DC | PRN
  Administered 2022-01-22 (×3): 50 ug via INTRAVENOUS

## 2022-01-22 MED ORDER — EPHEDRINE SULFATE-NACL 50-0.9 MG/10ML-% IV SOSY
PREFILLED_SYRINGE | INTRAVENOUS | Status: DC | PRN
Start: 2022-01-22 — End: 2022-01-22
  Administered 2022-01-22 (×2): 25 mg via INTRAVENOUS

## 2022-01-22 MED ORDER — MIDAZOLAM HCL 2 MG/2ML IJ SOLN
INTRAMUSCULAR | Status: DC | PRN
Start: 2022-01-22 — End: 2022-01-22
  Administered 2022-01-22: 2 mg via INTRAVENOUS

## 2022-01-22 MED ORDER — ACETAMINOPHEN 325 MG PO TABS: 325.0000 mg | ORAL_TABLET | Freq: Four times a day (QID) | ORAL | Status: DC | PRN

## 2022-01-22 MED ORDER — ONDANSETRON HCL 4 MG/2ML IJ SOLN
INTRAMUSCULAR | Status: DC | PRN
Start: 2022-01-22 — End: 2022-01-22
  Administered 2022-01-22: 4 mg via INTRAVENOUS

## 2022-01-22 MED ORDER — FENTANYL CITRATE (PF) 100 MCG/2ML IJ SOLN
INTRAMUSCULAR | Status: AC
  Filled 2022-01-22: qty 2

## 2022-01-22 MED ORDER — METHOCARBAMOL 1000 MG/10ML IJ SOLN
500.0000 mg | Freq: Four times a day (QID) | INTRAVENOUS | Status: DC | PRN
  Filled 2022-01-22 (×2): qty 5

## 2022-01-22 MED ORDER — NALOXONE HCL 4 MG/0.1ML NA LIQD: 1.0000 | NASAL | Status: DC | PRN

## 2022-01-22 MED ORDER — TRANEXAMIC ACID-NACL 1000-0.7 MG/100ML-% IV SOLN: 1000.0000 mg | Freq: Once | INTRAVENOUS | Status: DC

## 2022-01-22 MED ORDER — ORAL CARE MOUTH RINSE: 15.0000 mL | Freq: Once | OROMUCOSAL | Status: AC

## 2022-01-22 MED ORDER — METFORMIN HCL ER 500 MG PO TB24
1000.0000 mg | ORAL_TABLET | Freq: Every evening | ORAL | Status: DC
  Administered 2022-01-22 – 2022-01-23 (×2): 1000 mg via ORAL
  Filled 2022-01-22 (×2): qty 2

## 2022-01-22 MED ORDER — DEXAMETHASONE SODIUM PHOSPHATE 10 MG/ML IJ SOLN
10.0000 mg | Freq: Once | INTRAMUSCULAR | Status: AC
  Administered 2022-01-23: 10 mg via INTRAVENOUS
  Filled 2022-01-22: qty 1

## 2022-01-22 MED ORDER — ONDANSETRON HCL 4 MG/2ML IJ SOLN: 4.0000 mg | Freq: Four times a day (QID) | INTRAMUSCULAR | Status: DC | PRN

## 2022-01-22 MED ORDER — ONDANSETRON HCL 4 MG/2ML IJ SOLN: 4.0000 mg | Freq: Once | INTRAMUSCULAR | Status: DC | PRN

## 2022-01-22 MED ORDER — PANTOPRAZOLE SODIUM 40 MG PO TBEC
40.0000 mg | DELAYED_RELEASE_TABLET | Freq: Every day | ORAL | Status: DC
  Administered 2022-01-22 – 2022-01-24 (×3): 40 mg via ORAL
  Filled 2022-01-22 (×3): qty 1

## 2022-01-22 MED ORDER — METOCLOPRAMIDE HCL 5 MG/ML IJ SOLN: 5.0000 mg | Freq: Three times a day (TID) | INTRAMUSCULAR | Status: DC | PRN

## 2022-01-22 MED ORDER — PROPOFOL 10 MG/ML IV BOLUS
INTRAVENOUS | Status: DC | PRN
Start: 2022-01-22 — End: 2022-01-22
  Administered 2022-01-22: 30 mg via INTRAVENOUS

## 2022-01-22 MED ORDER — PHENOL 1.4 % MT LIQD: 1.0000 | OROMUCOSAL | Status: DC | PRN

## 2022-01-22 MED ORDER — PHENYLEPHRINE HCL-NACL 20-0.9 MG/250ML-% IV SOLN
INTRAVENOUS | Status: DC | PRN
  Administered 2022-01-22: 50 ug/min via INTRAVENOUS

## 2022-01-22 MED ORDER — ALUM & MAG HYDROXIDE-SIMETH 200-200-20 MG/5ML PO SUSP: 30.0000 mL | ORAL | Status: DC | PRN

## 2022-01-22 MED ORDER — CHLORHEXIDINE GLUCONATE 0.12 % MT SOLN
15.0000 mL | Freq: Once | OROMUCOSAL | Status: AC
  Administered 2022-01-22: 15 mL via OROMUCOSAL
  Filled 2022-01-22: qty 15

## 2022-01-22 MED ORDER — ASPIRIN 81 MG PO CHEW
81.0000 mg | CHEWABLE_TABLET | Freq: Two times a day (BID) | ORAL | Status: DC
  Administered 2022-01-22 – 2022-01-24 (×4): 81 mg via ORAL
  Filled 2022-01-22 (×4): qty 1

## 2022-01-22 MED ORDER — METOCLOPRAMIDE HCL 5 MG PO TABS: 5.0000 mg | ORAL_TABLET | Freq: Three times a day (TID) | ORAL | Status: DC | PRN

## 2022-01-22 MED ORDER — METOPROLOL SUCCINATE ER 50 MG PO TB24
50.0000 mg | ORAL_TABLET | Freq: Every day | ORAL | Status: DC
  Administered 2022-01-24: 50 mg via ORAL
  Filled 2022-01-22 (×2): qty 1

## 2022-01-22 MED ORDER — BUPIVACAINE IN DEXTROSE 0.75-8.25 % IT SOLN
INTRATHECAL | Status: DC | PRN
  Administered 2022-01-22: 1.8 mL via INTRATHECAL

## 2022-01-22 MED ORDER — DOCUSATE SODIUM 100 MG PO CAPS
100.0000 mg | ORAL_CAPSULE | Freq: Two times a day (BID) | ORAL | Status: DC
  Administered 2022-01-22 – 2022-01-24 (×4): 100 mg via ORAL
  Filled 2022-01-22 (×4): qty 1

## 2022-01-22 MED ORDER — MORPHINE SULFATE (PF) 2 MG/ML IV SOLN: 0.5000 mg | INTRAVENOUS | Status: DC | PRN

## 2022-01-22 MED ORDER — MENTHOL 3 MG MT LOZG: 1.0000 | LOZENGE | OROMUCOSAL | Status: DC | PRN

## 2022-01-22 MED ORDER — KETOROLAC TROMETHAMINE 15 MG/ML IJ SOLN
15.0000 mg | Freq: Four times a day (QID) | INTRAMUSCULAR | Status: AC
  Administered 2022-01-22 – 2022-01-23 (×3): 15 mg via INTRAVENOUS
  Filled 2022-01-22 (×3): qty 1

## 2022-01-22 MED ORDER — VANCOMYCIN HCL 1 G IV SOLR
INTRAVENOUS | Status: DC | PRN
Start: 2022-01-22 — End: 2022-01-22
  Administered 2022-01-22: 1000 mg

## 2022-01-22 MED ORDER — HYDROCODONE-ACETAMINOPHEN 7.5-325 MG PO TABS
1.0000 | ORAL_TABLET | ORAL | Status: DC | PRN
  Administered 2022-01-22: 2 via ORAL
  Administered 2022-01-24: 1 via ORAL
  Administered 2022-01-24 (×2): 2 via ORAL
  Filled 2022-01-22 (×4): qty 2

## 2022-01-22 MED ORDER — DEXAMETHASONE SODIUM PHOSPHATE 10 MG/ML IJ SOLN
INTRAMUSCULAR | Status: DC | PRN
Start: 2022-01-22 — End: 2022-01-22
  Administered 2022-01-22: 10 mg via INTRAVENOUS

## 2022-01-22 MED ORDER — TRANEXAMIC ACID-NACL 1000-0.7 MG/100ML-% IV SOLN
1000.0000 mg | INTRAVENOUS | Status: AC
  Administered 2022-01-22: 1000 mg via INTRAVENOUS
  Filled 2022-01-22: qty 100

## 2022-01-22 MED ORDER — SODIUM CHLORIDE 0.9 % IV SOLN
INTRAVENOUS | Status: DC | PRN
  Administered 2022-01-22: 2000 mg via TOPICAL

## 2022-01-22 MED ORDER — MORPHINE SULFATE ER 15 MG PO TBCR
30.0000 mg | EXTENDED_RELEASE_TABLET | Freq: Two times a day (BID) | ORAL | Status: DC
  Administered 2022-01-22 – 2022-01-24 (×4): 30 mg via ORAL
  Filled 2022-01-22 (×4): qty 2

## 2022-01-22 MED ORDER — SODIUM CHLORIDE 0.9 % IR SOLN
Status: DC | PRN
  Administered 2022-01-22: 1000 mL

## 2022-01-22 MED ORDER — POLYETHYLENE GLYCOL 3350 17 G PO PACK
17.0000 g | PACK | Freq: Every day | ORAL | Status: DC
  Administered 2022-01-22 – 2022-01-24 (×3): 17 g via ORAL
  Filled 2022-01-22 (×3): qty 1

## 2022-01-22 MED ORDER — BUPIVACAINE-MELOXICAM ER 400-12 MG/14ML IJ SOLN
INTRAMUSCULAR | Status: DC | PRN
Start: 2022-01-22 — End: 2022-01-22
  Administered 2022-01-22: 400 mg

## 2022-01-22 MED ORDER — LACTATED RINGERS IV SOLN: INTRAVENOUS | Status: DC

## 2022-01-22 MED ORDER — AMISULPRIDE (ANTIEMETIC) 5 MG/2ML IV SOLN: 10.0000 mg | Freq: Once | INTRAVENOUS | Status: DC | PRN

## 2022-01-22 MED ORDER — POVIDONE-IODINE 10 % EX SWAB
2.0000 "application " | Freq: Once | CUTANEOUS | Status: AC
  Administered 2022-01-22: 2 via TOPICAL

## 2022-01-22 MED ORDER — METHOCARBAMOL 500 MG PO TABS
500.0000 mg | ORAL_TABLET | Freq: Four times a day (QID) | ORAL | Status: DC | PRN
  Administered 2022-01-24: 500 mg via ORAL
  Filled 2022-01-22: qty 1

## 2022-01-22 MED ORDER — MIDAZOLAM HCL 2 MG/2ML IJ SOLN
INTRAMUSCULAR | Status: AC
  Filled 2022-01-22: qty 2

## 2022-01-22 MED ORDER — BUPIVACAINE-MELOXICAM ER 400-12 MG/14ML IJ SOLN
INTRAMUSCULAR | Status: AC
  Filled 2022-01-22: qty 1

## 2022-01-22 MED ORDER — SORBITOL 70 % SOLN: 30.0000 mL | Freq: Every day | Status: DC | PRN

## 2022-01-22 MED ORDER — ACETAMINOPHEN 10 MG/ML IV SOLN: 1000.0000 mg | Freq: Once | INTRAVENOUS | Status: DC | PRN

## 2022-01-22 MED ORDER — ACETAMINOPHEN 500 MG PO TABS
500.0000 mg | ORAL_TABLET | Freq: Four times a day (QID) | ORAL | Status: AC
  Administered 2022-01-22 – 2022-01-23 (×3): 500 mg via ORAL
  Filled 2022-01-22 (×3): qty 1

## 2022-01-22 MED ORDER — ONDANSETRON HCL 4 MG PO TABS: 4.0000 mg | ORAL_TABLET | Freq: Four times a day (QID) | ORAL | Status: DC | PRN

## 2022-01-22 MED ORDER — PHENYLEPHRINE 40 MCG/ML (10ML) SYRINGE FOR IV PUSH (FOR BLOOD PRESSURE SUPPORT)
PREFILLED_SYRINGE | INTRAVENOUS | Status: DC | PRN
Start: 2022-01-22 — End: 2022-01-22
  Administered 2022-01-22: 400 ug via INTRAVENOUS

## 2022-01-22 MED ORDER — CEFAZOLIN SODIUM-DEXTROSE 2-4 GM/100ML-% IV SOLN
2.0000 g | INTRAVENOUS | Status: AC
  Administered 2022-01-22: 2 g via INTRAVENOUS
  Filled 2022-01-22: qty 100

## 2022-01-22 MED ORDER — HYDROCHLOROTHIAZIDE 12.5 MG PO TABS
12.5000 mg | ORAL_TABLET | Freq: Every day | ORAL | Status: DC
  Administered 2022-01-22 – 2022-01-24 (×2): 12.5 mg via ORAL
  Filled 2022-01-22 (×3): qty 1

## 2022-01-22 MED ORDER — CEFAZOLIN SODIUM-DEXTROSE 2-4 GM/100ML-% IV SOLN
2.0000 g | Freq: Four times a day (QID) | INTRAVENOUS | Status: AC
  Administered 2022-01-22 – 2022-01-23 (×2): 2 g via INTRAVENOUS
  Filled 2022-01-22 (×2): qty 100

## 2022-01-22 MED ORDER — 0.9 % SODIUM CHLORIDE (POUR BTL) OPTIME
TOPICAL | Status: DC | PRN
  Administered 2022-01-22: 1000 mL

## 2022-01-22 MED ORDER — PROPOFOL 500 MG/50ML IV EMUL
INTRAVENOUS | Status: DC | PRN
Start: 2022-01-22 — End: 2022-01-22
  Administered 2022-01-22: 100 ug/kg/min via INTRAVENOUS

## 2022-01-22 MED ORDER — LISINOPRIL 10 MG PO TABS
10.0000 mg | ORAL_TABLET | Freq: Every day | ORAL | Status: DC
  Administered 2022-01-22 – 2022-01-24 (×2): 10 mg via ORAL
  Filled 2022-01-22 (×3): qty 1

## 2022-01-22 MED ORDER — NALOXONE HCL 0.4 MG/ML IJ SOLN: 0.4000 mg | INTRAMUSCULAR | Status: DC | PRN

## 2022-01-22 MED ORDER — VANCOMYCIN HCL 1000 MG IV SOLR
INTRAVENOUS | Status: AC
  Filled 2022-01-22: qty 20

## 2022-01-22 MED ORDER — INSULIN ASPART 100 UNIT/ML IJ SOLN: 0.0000 [IU] | INTRAMUSCULAR | Status: DC | PRN

## 2022-01-22 MED ORDER — DIPHENHYDRAMINE HCL 12.5 MG/5ML PO ELIX: 25.0000 mg | ORAL_SOLUTION | ORAL | Status: DC | PRN

## 2022-01-22 MED ORDER — MORPHINE SULFATE 15 MG PO TABS: 15.0000 mg | ORAL_TABLET | Freq: Two times a day (BID) | ORAL | Status: DC | PRN

## 2022-01-22 MED ORDER — LISINOPRIL-HYDROCHLOROTHIAZIDE 10-12.5 MG PO TABS: 1.0000 | ORAL_TABLET | Freq: Every day | ORAL | Status: DC

## 2022-01-22 MED ORDER — HYDROCODONE-ACETAMINOPHEN 5-325 MG PO TABS: 1.0000 | ORAL_TABLET | ORAL | Status: DC | PRN

## 2022-01-22 MED ORDER — SODIUM CHLORIDE 0.9 % IV SOLN: INTRAVENOUS | Status: DC

## 2022-01-22 SURGICAL SUPPLY — 63 items
ACETAB CUP W/GRIPTION 54 (Plate) ×2 IMPLANT
BAG COUNTER SPONGE SURGICOUNT (BAG) ×2 IMPLANT
BAG DECANTER FOR FLEXI CONT (MISCELLANEOUS) ×2 IMPLANT
CELLS DAT CNTRL 66122 CELL SVR (MISCELLANEOUS) IMPLANT
COVER PERINEAL POST (MISCELLANEOUS) ×2 IMPLANT
COVER SURGICAL LIGHT HANDLE (MISCELLANEOUS) ×2 IMPLANT
CUP ACETAB W/GRIPTION 54 (Plate) IMPLANT
DRAPE C-ARM 42X72 X-RAY (DRAPES) ×2 IMPLANT
DRAPE POUCH INSTRU U-SHP 10X18 (DRAPES) ×2 IMPLANT
DRAPE STERI IOBAN 125X83 (DRAPES) ×2 IMPLANT
DRAPE U-SHAPE 47X51 STRL (DRAPES) ×4 IMPLANT
DRESSING PEEL AND PLAC PRVNA20 (GAUZE/BANDAGES/DRESSINGS) IMPLANT
DRSG AQUACEL AG ADV 3.5X10 (GAUZE/BANDAGES/DRESSINGS) ×2 IMPLANT
DRSG PEEL AND PLACE PREVENA 20 (GAUZE/BANDAGES/DRESSINGS) ×2
DURAPREP 26ML APPLICATOR (WOUND CARE) ×4 IMPLANT
ELECT BLADE 4.0 EZ CLEAN MEGAD (MISCELLANEOUS) ×2
ELECT REM PT RETURN 9FT ADLT (ELECTROSURGICAL) ×2
ELECTRODE BLDE 4.0 EZ CLN MEGD (MISCELLANEOUS) ×1 IMPLANT
ELECTRODE REM PT RTRN 9FT ADLT (ELECTROSURGICAL) ×1 IMPLANT
GLOVE BIOGEL PI IND STRL 7.5 (GLOVE) ×4 IMPLANT
GLOVE BIOGEL PI INDICATOR 7.5 (GLOVE) ×4
GLOVE SURG LTX SZ7 (GLOVE) ×4 IMPLANT
GLOVE SURG UNDER POLY LF SZ7 (GLOVE) ×4 IMPLANT
GLOVE SURG UNDER POLY LF SZ7.5 (GLOVE) ×4 IMPLANT
GOWN STRL REIN XL XLG (GOWN DISPOSABLE) ×2 IMPLANT
GOWN STRL REUS W/ TWL LRG LVL3 (GOWN DISPOSABLE) IMPLANT
GOWN STRL REUS W/ TWL XL LVL3 (GOWN DISPOSABLE) ×1 IMPLANT
GOWN STRL REUS W/TWL LRG LVL3 (GOWN DISPOSABLE)
GOWN STRL REUS W/TWL XL LVL3 (GOWN DISPOSABLE) ×1
HANDPIECE INTERPULSE COAX TIP (DISPOSABLE) ×1
HEAD CERAMIC DELTA 36 PLUS 1.5 (Hips) ×1 IMPLANT
HOOD PEEL AWAY FLYTE STAYCOOL (MISCELLANEOUS) ×4 IMPLANT
IV NS IRRIG 3000ML ARTHROMATIC (IV SOLUTION) ×2 IMPLANT
JET LAVAGE IRRISEPT WOUND (IRRIGATION / IRRIGATOR) ×2
KIT BASIN OR (CUSTOM PROCEDURE TRAY) ×2 IMPLANT
LAVAGE JET IRRISEPT WOUND (IRRIGATION / IRRIGATOR) ×1 IMPLANT
LINER NEUTRAL 54X36MM PLUS 4 (Hips) ×1 IMPLANT
MARKER SKIN DUAL TIP RULER LAB (MISCELLANEOUS) ×2 IMPLANT
NDL SPNL 18GX3.5 QUINCKE PK (NEEDLE) ×1 IMPLANT
NEEDLE SPNL 18GX3.5 QUINCKE PK (NEEDLE) ×2 IMPLANT
PACK TOTAL JOINT (CUSTOM PROCEDURE TRAY) ×2 IMPLANT
PACK UNIVERSAL I (CUSTOM PROCEDURE TRAY) ×2 IMPLANT
RETRACTOR WND ALEXIS 18 MED (MISCELLANEOUS) IMPLANT
RTRCTR WOUND ALEXIS 18CM MED (MISCELLANEOUS)
SAW OSC TIP CART 19.5X105X1.3 (SAW) ×2 IMPLANT
SCREW 6.5MMX25MM (Screw) ×1 IMPLANT
SET HNDPC FAN SPRY TIP SCT (DISPOSABLE) ×1 IMPLANT
STAPLER VISISTAT 35W (STAPLE) IMPLANT
STEM FEM ACTIS HIGH SZ3 (Stem) ×1 IMPLANT
SUT ETHIBOND 2 V 37 (SUTURE) ×2 IMPLANT
SUT ETHILON 2 0 PSLX (SUTURE) ×2 IMPLANT
SUT VIC AB 0 CT1 27 (SUTURE) ×1
SUT VIC AB 0 CT1 27XBRD ANBCTR (SUTURE) ×1 IMPLANT
SUT VIC AB 1 CTX 36 (SUTURE) ×1
SUT VIC AB 1 CTX36XBRD ANBCTR (SUTURE) ×1 IMPLANT
SUT VIC AB 2-0 CT1 27 (SUTURE) ×2
SUT VIC AB 2-0 CT1 TAPERPNT 27 (SUTURE) ×2 IMPLANT
SYR 50ML LL SCALE MARK (SYRINGE) ×2 IMPLANT
TOWEL GREEN STERILE (TOWEL DISPOSABLE) ×2 IMPLANT
TRAY CATH 16FR W/PLASTIC CATH (SET/KITS/TRAYS/PACK) IMPLANT
TRAY FOLEY W/BAG SLVR 16FR (SET/KITS/TRAYS/PACK) ×1
TRAY FOLEY W/BAG SLVR 16FR ST (SET/KITS/TRAYS/PACK) ×1 IMPLANT
YANKAUER SUCT BULB TIP NO VENT (SUCTIONS) ×2 IMPLANT

## 2022-01-22 NOTE — Discharge Instructions (Signed)

## 2022-01-22 NOTE — Op Note (Signed)
LEFT TOTAL HIP ARTHROPLASTY ANTERIOR APPROACH  Procedure Note Esvin Hnat   [id number]  Pre-op Diagnosis: LEFT HIP DEGENERATIVE JOINT DISEASE     Post-op Diagnosis: same   Operative Procedures  1. Total hip replacement; Left hip; uncemented [id number]  2. Application of incisional VAC.  Surgeon: Gershon Mussel, M.D.  Assist: Oneal Grout, PA-C   Anesthesia: spinal  Prosthesis: Depuy Acetabulum: Pinnacle 54 mm Femur: Actis 3 HO Head: 36 mm size: +1.5 Liner: +4 Bearing Type: ceramic/poly  Total Hip Arthroplasty (Anterior Approach) Op Note:  After informed consent was obtained and the operative extremity marked in the holding area, the patient was brought back to the operating room and placed supine on the HANA table. Next, the operative extremity was prepped and draped in normal sterile fashion. Surgical timeout occurred verifying patient identification, surgical site, surgical procedure and administration of antibiotics.  A modified anterior Smith-Peterson approach to the hip was performed, using the interval between tensor fascia lata and sartorius.  Dissection was carried bluntly down onto the anterior hip capsule. The lateral femoral circumflex vessels were identified and coagulated. A capsulotomy was performed and the capsular flaps tagged for later repair.  The neck osteotomy was performed. The femoral head was removed which showed severe wear, the acetabular rim was cleared of soft tissue and attention was turned to reaming the acetabulum.  Sequential reaming was performed under fluoroscopic guidance. We reamed to a size 54 mm, and then impacted the acetabular shell. A 25 mm cancellous screw was placed through the shell for added fixation.  The liner was then placed after irrigation and attention turned to the femur.  After placing the femoral hook, the leg was taken to externally rotated, extended and adducted position taking care to perform soft tissue releases to  allow for adequate mobilization of the femur. Soft tissue was cleared from the shoulder of the greater trochanter and the hook elevator used to improve exposure of the proximal femur. Sequential broaching performed up to a size 3. Trial neck and head were placed. The leg was brought back up to neutral and the construct reduced.  Antibiotic irrigation was placed in the surgical wound and kept for at least 1 minute.  The position and sizing of components, offset and leg lengths were checked using fluoroscopy. Stability of the construct was checked in extension and external rotation without any subluxation or impingement of prosthesis. We dislocated the prosthesis, dropped the leg back into position, removed trial components, and irrigated copiously. The final stem and head was then placed, the leg brought back up, the system reduced and fluoroscopy used to verify positioning.  We irrigated, obtained hemostasis and closed the capsule using #2 ethibond suture.  One gram of vancomycin powder was placed in the surgical bed.   One gram of topical tranexamic acid was injected into the joint.  The fascia was closed with #1 vicryl plus, the deep fat layer was closed with 0 vicryl, the subcutaneous layers closed with 2.0 Vicryl Plus and the skin closed with 2.0 nylon and incisional VAC. A sterile dressing was applied. The patient was awakened in the operating room and taken to recovery in stable condition.  All sponge, needle, and instrument counts were correct at the end of the case.   Tessa Lerner, my PA, was a medical necessity for opening, closing, limb positioning, retracting, exposing, and overall facilitation and timely completion of the surgery.  Position: supine  Complications: see description of procedure.  Time Out: performed  Drains/Packing: none  Estimated blood loss: see anesthesia record  Returned to Recovery Room: in good condition.   Antibiotics: yes   Mechanical VTE (DVT) Prophylaxis:  sequential compression devices, TED thigh-high  Chemical VTE (DVT) Prophylaxis: aspirin   Fluid Replacement: see anesthesia record  Specimens Removed: 1 to pathology   Sponge and Instrument Count Correct? yes   PACU: portable radiograph - low AP   Plan/RTC: Return in 2 weeks for staple removal. Weight Bearing/Load Lower Extremity: full  Hip precautions: none Suture Removal: 2 weeks   N. Glee Arvin, MD [facility] 1:43 PM   Implant Name Type Inv. Item Serial No. Manufacturer Lot No. LRB No. Used Action  ACETAB CUP Cathe Mons - [id number] Plate ACETAB CUP W GRIPTION  [employer] [id number] Left 1 Implanted  LINER NEUTRAL 54X36MM PLUS 4 - [id number] Hips LINER NEUTRAL 54X36MM PLUS 4  [employer] [id number] Left 1 Implanted  SCREW 6.5MMX25MM - [id number] Screw SCREW 6.5MMX25MM  [employer] [id number] Left 1 Implanted  STEM FEM ACTIS HIGH SZ3 - [id number] Stem STEM FEM ACTIS HIGH SZ3  [employer] [id number] Left 1 Implanted  HEAD CERAMIC DELTA 36 PLUS 1.5 - [id number] Hips HEAD CERAMIC DELTA 36 PLUS 1.5  [employer] [id number] Left 1 Implanted

## 2022-01-22 NOTE — H&P (Signed)
PREOPERATIVE H&P  Chief Complaint: LEFT HIP DEGENERATIVE JOINT DISEASE  HPI: Kent Rodriguez is a 76 y.o. male who presents for surgical treatment of LEFT HIP DEGENERATIVE JOINT DISEASE.  He denies any changes in medical history.  Past Medical History:  Diagnosis Date   Back pain    lower back   COPD (chronic obstructive pulmonary disease) (HCC)    Dependent on wheelchair    Depression    Diabetes mellitus without complication (HCC)    type 2   HLD (hyperlipidemia)    Hypertension    Insomnia    Osteoarthritis of left hip    Seasonal allergies    Sleep apnea    uses CPAP nightly   Spondylolysis of cervical region    Spondylolysis of lumbar region    Past Surgical History:  Procedure Laterality Date   COLONOSCOPY     several x 3   JOINT REPLACEMENT  2015   left knee   PALATE SURGERY  2008   benign   UPPER GI ENDOSCOPY     several - stomach ulcers   Social History   Socioeconomic History   Marital status: Married    Spouse name: Not on file   Number of children: Not on file   Years of education: Not on file   Highest education level: Not on file  Occupational History   Not on file  Tobacco Use   Smoking status: Former    Packs/day: 1.00    Years: 20.00    Pack years: 20.00    Types: Cigarettes    Quit date: 08/24/1991    Years since quitting: 30.4   Smokeless tobacco: Never  Vaping Use   Vaping Use: Never used  Substance and Sexual Activity   Alcohol use: Not Currently    Comment: wine   Drug use: Never   Sexual activity: Not Currently  Other Topics Concern   Not on file  Social History Narrative   Not on file   Social Determinants of Health   Financial Resource Strain: Not on file  Food Insecurity: Not on file  Transportation Needs: Not on file  Physical Activity: Not on file  Stress: Not on file  Social Connections: Not on file   History reviewed. No pertinent family history. Allergies  Allergen Reactions   Oxycodone Anxiety,  Palpitations and Shortness Of Breath   Gabapentin Rash   Prior to Admission medications   Medication Sig Start Date End Date Taking? Authorizing Provider  aspirin EC 81 MG tablet Take 1 tablet (81 mg total) by mouth 2 (two) times daily. To be taken after surgery to prevent blood clots 01/15/22   Cristie Hem, PA-C  cholecalciferol (VITAMIN D3) 25 MCG (1000 UNIT) tablet Take 1,000 Units by mouth daily.   Yes [provider]  docusate sodium (COLACE) 100 MG capsule Take 1 capsule (100 mg total) by mouth daily as needed. 01/15/22 01/15/23  Cristie Hem, PA-C  fluticasone (FLONASE) 50 MCG/ACT nasal spray Place 1 spray into both nostrils daily as needed for allergies or rhinitis.   Yes [provider]  lisinopril-hydrochlorothiazide (ZESTORETIC) 10-12.5 MG tablet Take 1 tablet by mouth daily. 11/10/21  Yes [provider]  meloxicam (MOBIC) 15 MG tablet Take 15 mg by mouth daily. 11/10/21  Yes [provider]  metFORMIN (GLUCOPHAGE-XR) 500 MG 24 hr tablet Take 1,000 mg by mouth every evening. 11/10/21  Yes [provider]  methocarbamol (ROBAXIN) 500 MG tablet Take 1 tablet (500  mg total) by mouth 2 (two) times daily as needed. 01/15/22   Cristie Hem, PA-C  metoprolol succinate (TOPROL-XL) 50 MG 24 hr tablet Take 50 mg by mouth daily. 11/01/21  Yes [provider]  morphine (MS CONTIN) 30 MG 12 hr tablet Take 30 mg by mouth every 12 (twelve) hours. 12/15/21  Yes [provider]  morphine (MSIR) 15 MG tablet Take 15 mg by mouth 2 (two) times daily as needed for severe pain. 12/15/21  Yes [provider]  naloxone (NARCAN) nasal spray 4 mg/0.1 mL Place 1 spray into the nose as needed (opiate overdose). 09/25/21  Yes [provider]  nystatin-triamcinolone (MYCOLOG II) cream Apply 1 application topically 2 (two) times daily as needed for rash. 09/18/21  Yes [provider]  Omega-3 Fatty Acids (FISH OIL) 1200 MG  CAPS Take 1,200 mg by mouth daily.   Yes [provider]  ondansetron (ZOFRAN) 4 MG tablet Take 1 tablet (4 mg total) by mouth every 8 (eight) hours as needed for nausea or vomiting. 01/15/22   Cristie Hem, PA-C  simvastatin (ZOCOR) 20 MG tablet Take 20 mg by mouth at bedtime. 11/10/21  Yes [provider]  sulfamethoxazole-trimethoprim (BACTRIM DS) 800-160 MG tablet Take 1 tablet by mouth 2 (two) times daily. To be taken after surgery 01/15/22   Cristie Hem, PA-C  traZODone (DESYREL) 50 MG tablet Take 50 mg by mouth at bedtime as needed for sleep. 11/29/21  Yes [provider]  zolpidem (AMBIEN) 10 MG tablet Take 10 mg by mouth at bedtime. 11/29/21  Yes [provider]     Positive ROS: All other systems have been reviewed and were otherwise negative with the exception of those mentioned in the HPI and as above.  Physical Exam: General: Alert, no acute distress Cardiovascular: No pedal edema Respiratory: No cyanosis, no use of accessory musculature GI: abdomen soft Skin: No lesions in the area of chief complaint Neurologic: Sensation intact distally Psychiatric: Patient is competent for consent with normal mood and affect Lymphatic: no lymphedema  MUSCULOSKELETAL: exam stable  Assessment: LEFT HIP DEGENERATIVE JOINT DISEASE  Plan: Plan for Procedure(s): LEFT TOTAL HIP ARTHROPLASTY ANTERIOR APPROACH  The risks benefits and alternatives were discussed with the patient including but not limited to the risks of nonoperative treatment, versus surgical intervention including infection, bleeding, nerve injury,  blood clots, cardiopulmonary complications, morbidity, mortality, among others, and they were willing to proceed.   Preoperative templating of the joint replacement has been completed, documented, and submitted to the Operating Room personnel in order to optimize intra-operative equipment management.   Glee Arvin, MD 01/22/2022 11:14  AM

## 2022-01-22 NOTE — Anesthesia Procedure Notes (Signed)
Spinal  Patient location during procedure: OR Start time: 01/22/2022 12:10 PM End time: 01/22/2022 12:17 PM Reason for block: surgical anesthesia Staffing Performed: anesthesiologist  Anesthesiologist: Leonides Grills, MD Preanesthetic Checklist Completed: patient identified, IV checked, risks and benefits discussed, surgical consent, monitors and equipment checked, pre-op evaluation and timeout performed Spinal Block Patient position: sitting Prep: DuraPrep Patient monitoring: cardiac monitor, continuous pulse ox and blood pressure Approach: midline Location: L4-5 Injection technique: single-shot Needle Needle type: Pencan  Needle gauge: 24 G Needle length: 9 cm Assessment Sensory level: T10 Events: CSF return Additional Notes Functioning IV was confirmed and monitors were applied. Sterile prep and drape, including hand hygiene and sterile gloves were used. The patient was positioned and the spine was prepped. The skin was anesthetized with lidocaine.  Free flow of clear CSF was obtained prior to injecting local anesthetic into the CSF.  The spinal needle aspirated freely following injection.  The needle was carefully withdrawn.  The patient tolerated the procedure well.

## 2022-01-22 NOTE — Transfer of Care (Signed)
Immediate Anesthesia Transfer of Care Note  Patient: Kent Rodriguez  Procedure(s) Performed: LEFT TOTAL HIP ARTHROPLASTY ANTERIOR APPROACH (Left: Hip)  Patient Location: PACU  Anesthesia Type:Spinal  Level of Consciousness: awake, alert  and oriented  Airway & Oxygen Therapy: Patient Spontanous Breathing  Post-op Assessment: Report given to RN and Post -op Vital signs reviewed and stable  Post vital signs: Reviewed and stable  Last Vitals:  Vitals Value Taken Time  BP 97/53 01/22/22 1412  Temp 36.4 C 01/22/22 1405  Pulse 74 01/22/22 1412  Resp 13 01/22/22 1412  SpO2 93 % 01/22/22 1412  Vitals shown include unvalidated device data.  Last Pain:  Vitals:   01/22/22 1023  TempSrc:   PainSc: 8       Patients Stated Pain Goal: 3 (01/22/22 1023)  Complications: No notable events documented.

## 2022-01-23 ENCOUNTER — Other Ambulatory Visit: Payer: Self-pay

## 2022-01-23 ENCOUNTER — Other Ambulatory Visit: Payer: Self-pay | Admitting: Physician Assistant

## 2022-01-23 DIAGNOSIS — M1612 Unilateral primary osteoarthritis, left hip: Secondary | ICD-10-CM | POA: Diagnosis not present

## 2022-01-23 LAB — BASIC METABOLIC PANEL
Anion gap: 10 (ref 5–15)
BUN: 24 mg/dL — ABNORMAL HIGH (ref 8–23)
CO2: 26 mmol/L (ref 22–32)
Calcium: 8.7 mg/dL — ABNORMAL LOW (ref 8.9–10.3)
Chloride: 102 mmol/L (ref 98–111)
Creatinine, Ser: 1.34 mg/dL — ABNORMAL HIGH (ref 0.61–1.24)
GFR, Estimated: 56 mL/min — ABNORMAL LOW (ref >60)
Glucose, Bld: 123 mg/dL — ABNORMAL HIGH (ref 70–99)
Potassium: 4.7 mmol/L (ref 3.5–5.1)
Sodium: 138 mmol/L (ref 135–145)

## 2022-01-23 LAB — CBC
HCT: 37.7 % — ABNORMAL LOW (ref 39.0–52.0)
Hemoglobin: 12.8 g/dL — ABNORMAL LOW (ref 13.0–17.0)
MCH: 31.1 pg (ref 26.0–34.0)
MCHC: 34 g/dL (ref 30.0–36.0)
MCV: 91.7 fL (ref 80.0–100.0)
Platelets: 245 10*3/uL (ref 150–400)
RBC: 4.11 MIL/uL — ABNORMAL LOW (ref 4.22–5.81)
RDW: 13 % (ref 11.5–15.5)
WBC: 11.8 10*3/uL — ABNORMAL HIGH (ref 4.0–10.5)
nRBC: 0 % (ref 0.0–0.2)

## 2022-01-23 MED ORDER — SODIUM CHLORIDE 0.9 % IV BOLUS
1000.0000 mL | Freq: Once | INTRAVENOUS | Status: AC
  Administered 2022-01-23: 1000 mL via INTRAVENOUS

## 2022-01-23 MED ORDER — HYDROCODONE-ACETAMINOPHEN 7.5-325 MG PO TABS: 1.0000 | ORAL_TABLET | Freq: Four times a day (QID) | ORAL | 0 refills | Status: DC | PRN

## 2022-01-23 NOTE — Evaluation (Signed)
Occupational Therapy Evaluation Patient Details Name: Kent Rodriguez MRN: [id number] DOB: 29-Feb-1946 Today's Date: 01/23/2022   History of Present Illness Patient is 76 y.o. male s/p Lt THA anterior approach on 01/22/22 with PMH significant for COPD, DM, depression, HLD, HTN, OA, back pain, Lt TKA in 2005.   Clinical Impression   Pt requires assistance at baseline for ADLs, spouse assists with showering and LB bathing. Pt transfers mostly and uses power w/c throughout his home. Pt has all DME and AE needed. Pt currently close to baseline, requiring min-max A for ADLs, min A for bed mobility and min-mod A +2 for transfers. Pt fatigues quickly and is only able to walk short distance, requires cues to keep RW close to body and walks with wide BOS. Pt presenting with impairments listed below, will follow acutely. Recommend SNF at d/c.      Recommendations for follow up therapy are one component of a multi-disciplinary discharge planning process, led by the attending physician.  Recommendations may be updated based on patient status, additional functional criteria and insurance authorization.   Follow Up Recommendations  Skilled nursing-short term rehab (<3 hours/day)    Assistance Recommended at Discharge Set up Supervision/Assistance  Patient can return home with the following A lot of help with walking and/or transfers;A lot of help with bathing/dressing/bathroom;Assistance with cooking/housework;Help with stairs or ramp for entrance;Assist for transportation    Functional Status Assessment  Patient has had a recent decline in their functional status and demonstrates the ability to make significant improvements in function in a reasonable and predictable amount of time.  Equipment Recommendations  None recommended by OT;Other (comment) (pt has all needed DME)    Recommendations for Other Services       Precautions / Restrictions Precautions Precautions: Fall Precaution Comments: 3  in last year Restrictions Weight Bearing Restrictions: No      Mobility Bed Mobility Overal bed mobility: Needs Assistance Bed Mobility: Supine to Sit     Supine to sit: Min assist, HOB elevated          Transfers Overall transfer level: Needs assistance Equipment used: Rolling walker (2 wheels) Transfers: Sit to/from Stand Sit to Stand: Min assist, Mod assist, +2 physical assistance                  Balance Overall balance assessment: Needs assistance Sitting-balance support: Feet supported, Bilateral upper extremity supported Sitting balance-Leahy Scale: Fair Sitting balance - Comments: uses bedrails for support sitting EOB   Standing balance support: During functional activity, Reliant on assistive device for balance Standing balance-Leahy Scale: Poor Standing balance comment: wide BOS, relies heavily on RW                           ADL either performed or assessed with clinical judgement   ADL Overall ADL's : Needs assistance/impaired Eating/Feeding: Set up;Sitting   Grooming: Set up;Sitting   Upper Body Bathing: Minimal assistance;Sitting   Lower Body Bathing: Maximal assistance;Sitting/lateral leans Lower Body Bathing Details (indicate cue type and reason): this is baseline per pt Upper Body Dressing : Minimal assistance;Sitting Upper Body Dressing Details (indicate cue type and reason): donned gown sitting EOB Lower Body Dressing: Maximal assistance;Sitting/lateral leans   Toilet Transfer: Moderate assistance;Minimal assistance;+2 for physical assistance;Rolling walker (2 wheels);Ambulation;Regular Teacher, adult education Details (indicate cue type and reason): simulated to chair Toileting- Clothing Manipulation and Hygiene: Maximal assistance;Sitting/lateral lean       Functional mobility during ADLs: Rolling  walker (2 wheels);Moderate assistance;Minimal assistance       Vision   Vision Assessment?: No apparent visual deficits      Perception     Praxis      Pertinent Vitals/Pain Pain Assessment Pain Assessment: No/denies pain     Hand Dominance     Extremity/Trunk Assessment Upper Extremity Assessment Upper Extremity Assessment: Overall WFL for tasks assessed   Lower Extremity Assessment Lower Extremity Assessment: Defer to PT evaluation   Cervical / Trunk Assessment Cervical / Trunk Assessment: Normal   Communication Communication Communication: No difficulties   Cognition Arousal/Alertness: Awake/alert Behavior During Therapy: WFL for tasks assessed/performed Overall Cognitive Status: Within Functional Limits for tasks assessed                                       General Comments  VSS on RA    Exercises     Shoulder Instructions      Home Living Family/patient expects to be discharged to:: Private residence Living Arrangements: Spouse/significant other Available Help at Discharge: Family Type of Home: House Home Access: Ramped entrance     Home Layout: One level     Bathroom Shower/Tub: Producer, television/film/video: Handicapped height Bathroom Accessibility: Yes   Home Equipment: Wheelchair - Surveyor, quantity (2 wheels);Rollator (4 wheels);Grab bars - tub/shower;Hand held shower head;Grab bars - toilet;Shower seat;Adaptive equipment;Wheelchair - Recruitment consultant Equipment: Reacher;Long-handled sponge;Sock aid Additional Comments: husband works from home in basement but is there if needed.      Prior Functioning/Environment Prior Level of Function : Needs assist             Mobility Comments: pt has been using power wheelchair for ~1 year to mobilize ADLs Comments: spouse helps with lower body dressing,pt can do upper body while sitting. spouse helps with LB bathing        OT Problem List: Decreased strength;Decreased range of motion;Decreased activity tolerance;Impaired balance (sitting and/or standing);Decreased safety awareness;Decreased  knowledge of use of DME or AE      OT Treatment/Interventions: Self-care/ADL training;Therapeutic exercise;Energy conservation;DME and/or AE instruction;Therapeutic activities;Patient/family education;Balance training    OT Goals(Current goals can be found in the care plan section) Acute Rehab OT Goals Patient Stated Goal: to get better OT Goal Formulation: With patient Time For Goal Achievement: 02/06/22 Potential to Achieve Goals: Good ADL Goals Pt Will Perform Upper Body Dressing: with supervision;sitting Pt Will Perform Lower Body Dressing: with mod assist;with adaptive equipment;sit to/from stand;sitting/lateral leans Pt Will Transfer to Toilet: with min guard assist;ambulating;regular height toilet Pt Will Perform Tub/Shower Transfer: with min assist;Shower transfer;shower seat;ambulating;rolling walker  OT Frequency: Min 2X/week    Co-evaluation              AM-PAC OT "6 Clicks" Daily Activity     Outcome Measure Help from another person eating meals?: None Help from another person taking care of personal grooming?: A Little Help from another person toileting, which includes using toliet, bedpan, or urinal?: A Lot Help from another person bathing (including washing, rinsing, drying)?: A Lot Help from another person to put on and taking off regular upper body clothing?: A Little Help from another person to put on and taking off regular lower body clothing?: Total 6 Click Score: 15   End of Session Equipment Utilized During Treatment: Rolling walker (2 wheels);Gait belt Nurse Communication: Mobility status  Activity Tolerance: Patient limited by fatigue  Patient left: in chair;with call bell/phone within reach  OT Visit Diagnosis: Unsteadiness on feet (R26.81);Other abnormalities of gait and mobility (R26.89);Repeated falls (R29.6);Muscle weakness (generalized) (M62.81);History of falling (Z91.81)                Time: 7412-8786 OT Time Calculation (min): 28  min Charges:  OT General Charges $OT Visit: 1 Visit OT Evaluation $OT Eval Moderate Complexity: 1 [address], OTD, OTR/L Acute Rehab [phone]   Mayer Masker 01/23/2022, 10:06 AM

## 2022-01-23 NOTE — Evaluation (Signed)
Physical Therapy Evaluation Patient Details Name: Kent Rodriguez MRN: [id number] DOB: 05-Oct-1946 Today's Date: 01/23/2022  History of Present Illness  Patient is 76 y.o. male s/p Lt THA anterior approach on 01/22/22 with PMH significant for COPD, DM, depression, HLD, HTN, OA, back pain, Lt TKA in 2005.   Clinical Impression  Kent Rodriguez is a 76 y.o. male POD 1 s/p Lt THA. Patient reports modified independence with power wheelchair and grab bars for pivot transfers over the last year with mobility. Patient is now limited by functional impairments (see PT problem list below) and requires Mod+2 assist for transfers and gait with RW. Patient was able to ambulate ~10 feet with RW and mod assist +2 to move from EOB to recliner. Patient has substantial adaptive equipment at home and has WC accessible van for transportation and ramp for home. Currently he is requiring increased physical assist for transfers and would benefit from ST rehab at SNF setting to improve independence prior to return home. Patient will benefit from continued skilled PT interventions to address impairments and progress towards PLOF. Acute PT will follow to progress mobility and stair training in preparation for safe discharge.        Recommendations for follow up therapy are one component of a multi-disciplinary discharge planning process, led by the attending physician.  Recommendations may be updated based on patient status, additional functional criteria and insurance authorization.  Follow Up Recommendations Skilled nursing-short term rehab (<3 hours/day)    Assistance Recommended at Discharge Frequent or constant Supervision/Assistance  Patient can return home with the following  A lot of help with walking and/or transfers;A lot of help with bathing/dressing/bathroom;Assistance with cooking/housework;Assist for transportation;Help with stairs or ramp for entrance    Equipment Recommendations Other (comment)  (wide RW if pt qualifies)  Recommendations for Other Services       Functional Status Assessment Patient has had a recent decline in their functional status and demonstrates the ability to make significant improvements in function in a reasonable and predictable amount of time.     Precautions / Restrictions Precautions Precautions: Fall Precaution Comments: 3 in last year Restrictions Weight Bearing Restrictions: No Other Position/Activity Restrictions: WBAT      Mobility  Bed Mobility Overal bed mobility: Needs Assistance Bed Mobility: Supine to Sit     Supine to sit: Min assist, HOB elevated     General bed mobility comments: cues for pt to use bed rail to turn and raise trunk up to EOB. pt initiated walking LE's off EOB and min assist needed to fully raise trunk.    Transfers Overall transfer level: Needs assistance Equipment used: Rolling walker (2 wheels) Transfers: Sit to/from Stand Sit to Stand: Min assist, +2 safety/equipment, +2 physical assistance, From elevated surface, Mod assist           General transfer comment: EOB slightly elevated, Min +2 assist to initaite and steady with rise, cues for pt to use bil UE's for power up from EOB. Assist needed to guide hand to recliner for controlled lowering, Mod assist to lower.    Ambulation/Gait Ambulation/Gait assistance: Mod assist, +2 safety/equipment Gait Distance (Feet): 10 Feet Assistive device: Rolling walker (2 wheels) Gait Pattern/deviations: Step-through pattern, Decreased step length - right, Decreased step length - left, Decreased stride length, Trunk flexed, Wide base of support, Knees buckling Gait velocity: decr     General Gait Details: pt with flexed posture and bil knees flexed with gait and buckling slightly with fatigue. pt with very wide  stand and would benefit from wide RW however current weight may not qualify him. pt stance limited by hip ROM. Mod assist needed to sequence turn and step  pattern to RW to sit in recliner.  Stairs            Wheelchair Mobility    Modified Rankin (Stroke Patients Only)       Balance Overall balance assessment: Needs assistance Sitting-balance support: Feet supported, Bilateral upper extremity supported Sitting balance-Leahy Scale: Fair     Standing balance support: During functional activity, Reliant on assistive device for balance, Bilateral upper extremity supported Standing balance-Leahy Scale: Poor Standing balance comment: heavy reliance on external support of RW and therapist, WBOS                             Pertinent Vitals/Pain      Home Living Family/patient expects to be discharged to:: Private residence Living Arrangements: Spouse/significant other Available Help at Discharge: Family Type of Home: House Home Access: Ramped entrance       Home Layout: One level Home Equipment: Wheelchair - Surveyor, quantity (2 wheels);Rollator (4 wheels);Grab bars - tub/shower;Hand held shower head;Grab bars - toilet;Shower seat;Adaptive equipment;Wheelchair - manual Additional Comments: husband works from home in basement but is there if needed.    Prior Function Prior Level of Function : Needs assist             Mobility Comments: pt has been using power wheelchair for ~1 year to mobilize ADLs Comments: spouse helps with lower body dressing,pt can do upper body while sitting. spouse helps with LB bathing     Hand Dominance        Extremity/Trunk Assessment   Upper Extremity Assessment Upper Extremity Assessment: Overall WFL for tasks assessed    Lower Extremity Assessment Lower Extremity Assessment: Defer to PT evaluation    Cervical / Trunk Assessment Cervical / Trunk Assessment: Normal  Communication   Communication: No difficulties  Cognition Arousal/Alertness: Awake/alert Behavior During Therapy: WFL for tasks assessed/performed Overall Cognitive Status: Within Functional Limits  for tasks assessed                                          General Comments General comments (skin integrity, edema, etc.): VSS on RA    Exercises     Assessment/Plan    PT Assessment Patient needs continued PT services  PT Problem List Decreased strength;Decreased range of motion;Decreased activity tolerance;Decreased balance;Decreased mobility;Decreased knowledge of use of DME;Decreased knowledge of precautions;Pain;Obesity       PT Treatment Interventions DME instruction;Gait training;Stair training;Functional mobility training;Therapeutic activities;Therapeutic exercise;Balance training;Neuromuscular re-education;Patient/family education    PT Goals (Current goals can be found in the Care Plan section)  Acute Rehab PT Goals Patient Stated Goal: be able to walk slightly longer distances and prepare for Rt THA and TKA PT Goal Formulation: With patient Time For Goal Achievement: 01/30/22 Potential to Achieve Goals: Good    Frequency 7X/week     Co-evaluation PT/OT/SLP Co-Evaluation/Treatment: Yes Reason for Co-Treatment: To address functional/ADL transfers;For patient/therapist safety PT goals addressed during session: Mobility/safety with mobility;Proper use of DME;Balance OT goals addressed during session: ADL's and self-care;Strengthening/ROM       AM-PAC PT "6 Clicks" Mobility  Outcome Measure Help needed turning from your back to your side while in a flat bed without using  bedrails?: A Little Help needed moving from lying on your back to sitting on the side of a flat bed without using bedrails?: A Little Help needed moving to and from a bed to a chair (including a wheelchair)?: A Lot Help needed standing up from a chair using your arms (e.g., wheelchair or bedside chair)?: A Lot Help needed to walk in hospital room?: A Lot Help needed climbing 3-5 steps with a railing? : Total 6 Click Score: 13    End of Session Equipment Utilized During  Treatment: Gait belt Activity Tolerance: Patient tolerated treatment well Patient left: in chair;with call bell/phone within reach Nurse Communication: Mobility status PT Visit Diagnosis: Muscle weakness (generalized) (M62.81);Difficulty in walking, not elsewhere classified (R26.2);Unsteadiness on feet (R26.81)    Time: 5093-2671 PT Time Calculation (min) (ACUTE ONLY): 35 min   Charges:   PT Evaluation $PT Eval Low Complexity: 1 Low          Wynn Maudlin, DPT Acute Rehabilitation Services Office [phone] Pager [phone]   Anitra Lauth 01/23/2022, 11:58 AM

## 2022-01-23 NOTE — TOC Progression Note (Signed)
Transition of Care [facility]) - Progression Note    Patient Details  Name: Kent Rodriguez MRN: [id number] Date of Birth: 25-Apr-1946  [facility]) CM/SW Contact  Joanne Chars, LCSW Phone Number: 01/23/2022, 1:33 PM  Clinical Narrative: CSW met with pt and husband, Kent Rodriguez.  Permission given to speak with husband present.  They are agreeable to SNF, have specific facility in [facility] [place], [phone], that they are interested in, not interested in other facilities in [place].  Discussed SNF facilities in [facility] and they would like referral sent to higher rated facilities.  Choice document given.  Pt is vaccinated for covid with 3 boosters.  CSW called [facility] and left message with admissions dept. Referral sent out in hub to [facility], [facility], and [facility].     CSW received phone call from [place] at [facility], requests clinicals faxed to [phone], which was done.  Expected Discharge Plan: [place] Barriers to Discharge: Continued Medical Work up, SNF Pending bed offer  Expected Discharge Plan and Services Expected Discharge Plan: [place]   Discharge Planning Services: CM Consult   Living arrangements for the past 2 months: Single Family Home Expected Discharge Date: 01/23/22                                     Social Determinants of Health (SDOH) Interventions    Readmission Risk Interventions No flowsheet data found.

## 2022-01-23 NOTE — Anesthesia Postprocedure Evaluation (Signed)
Anesthesia Post Note  Patient: Kent Rodriguez  Procedure(s) Performed: LEFT TOTAL HIP ARTHROPLASTY ANTERIOR APPROACH (Left: Hip)     Patient location during evaluation: PACU Anesthesia Type: Spinal Level of consciousness: awake Pain management: pain level controlled Vital Signs Assessment: post-procedure vital signs reviewed and stable Respiratory status: spontaneous breathing, respiratory function stable and patient connected to nasal cannula oxygen Cardiovascular status: blood pressure returned to baseline and stable Postop Assessment: no headache, no backache and no apparent nausea or vomiting Anesthetic complications: no   No notable events documented.  Last Vitals:  Vitals:   01/22/22 1623 01/22/22 2001  BP: 135/64 (!) 103/50  Pulse: 97 93  Resp: 19 18  Temp: 36.7 C 36.7 C  SpO2: 96% 95%    Last Pain:  Vitals:   01/22/22 2001  TempSrc: Oral  PainSc: 0-No pain                 Sera Hitsman P Gabre[place]

## 2022-01-23 NOTE — Progress Notes (Signed)
Subjective: 1 Day Post-Op Procedure(s) (LRB): LEFT TOTAL HIP ARTHROPLASTY ANTERIOR APPROACH (Left) Patient reports pain as mild.  Tolerating norco  Objective: Vital signs in last 24 hours: Temp:  [97.6 F (36.4 C)-98.3 F (36.8 C)] 98.1 F (36.7 C) (02/20 2001) Pulse Rate:  [65-97] 93 (02/20 2001) Resp:  [13-20] 18 (02/20 2001) BP: (80-135)/(47-99) 103/50 (02/20 2001) SpO2:  [95 %-99 %] 95 % (02/20 2001) Weight:  [108.9 kg] 108.9 kg (02/20 1006)  Intake/Output from previous day: 02/20 0701 - 02/21 0700 In: 818 [P.O.:118; I.V.:700] Out: 925 [Urine:875; Blood:50] Intake/Output this shift: No intake/output data recorded.  Recent Labs    01/23/22 0336  HGB 12.8*   Recent Labs    01/23/22 0336  WBC 11.8*  RBC 4.11*  HCT 37.7*  PLT 245   Recent Labs    01/23/22 0336  NA 138  K 4.7  CL 102  CO2 26  BUN 24*  CREATININE 1.34*  GLUCOSE 123*  CALCIUM 8.7*   No results for input(s): LABPT, INR in the last 72 hours.  Neurologically intact Neurovascular intact Sensation intact distally Intact pulses distally Dorsiflexion/Plantar flexion intact Incision: dressing C/D/I No cellulitis present Compartment soft   Assessment/Plan: 1 Day Post-Op Procedure(s) (LRB): LEFT TOTAL HIP ARTHROPLASTY ANTERIOR APPROACH (Left) Advance diet Up with therapy Discharge home with home health after second PT session today as long as mobilizing well.  Will keep overnight if needed  WBAT LLE ABLA- mild and stable AKI- continue po and iv fluids      Cristie Hem 01/23/2022, 7:51 AM

## 2022-01-23 NOTE — TOC Initial Note (Addendum)
Transition of Care [facility]) - Initial/Assessment Note    Patient Details  Name: Kent Rodriguez MRN: [id number] Date of Birth: [birth year]  Transition of Care [facility]) CM/SW Contact:    Sharin Mons, RN Phone Number: 01/23/2022, 10:07 AM  Clinical Narrative:    Presents with L hip DJD.   S/P  Total hip replacement; Left hip , application of incisional VAC 2/20.         NCM spoke with pt @ bedside regarding d/c planning. Pt states lives with spouse, Cecilie Lowers. States has been W/C bound for close to 2 yrs. States back and joint issues  has caused him to be disabled. States needs assistance with ADL's (bathing and dressing).States spouse supportive works  full time from home. Feels post surgically he can benefit from SNF / REHAB  @ D/C. Pt eval results pending... Pt states would like to transition to [facility]. Pt provided NCM a [employer], Demetrius Charity  [phone]. Information given to CSW. Pt with DME: CPAP and powered W/C @ bedside.   TOC team following and will continue to assist with needs.  Expected Discharge Plan: Skilled Nursing Facility Barriers to Discharge: Continued Medical Work up   Patient Goals and CMS Choice Patient states their goals for this hospitalization and ongoing recovery are:: get better and go home      Expected Discharge Plan and Services Expected Discharge Plan: [place]   Discharge Planning Services: CM Consult   Living arrangements for the past 2 months: Single Family Home Expected Discharge Date: 01/23/22                                    Prior Living Arrangements/Services Living arrangements for the past 2 months: Single Family Home Lives with:: Spouse Patient language and need for interpreter reviewed:: Yes Do you feel safe going back to the place where you live?: Yes      Need for Family Participation in Patient Care: Yes (Comment) Care giver support system in place?: Yes (comment) Current home  services: DME (electric w/c) Criminal Activity/Legal Involvement Pertinent to Current Situation/Hospitalization: No - Comment as needed  Activities of Daily Living Home Assistive Devices/Equipment: Bedside commode/3-in-1, Blood pressure cuff, Built-in shower seat, CBG Meter, Electric scooter, Grab bars around toilet, Raised toilet seat with rails, Shower chair with back, Wheelchair ADL Screening (condition at time of admission) Patient's cognitive ability adequate to safely complete daily activities?: Yes Is the patient deaf or have difficulty hearing?: No Does the patient have difficulty seeing, even when wearing glasses/contacts?: No Does the patient have difficulty concentrating, remembering, or making decisions?: No Patient able to express need for assistance with ADLs?: Yes Does the patient have difficulty dressing or bathing?: Yes Independently performs ADLs?: No Communication: Independent Dressing (OT): Needs assistance Is this a change from baseline?: Pre-admission baseline Grooming: Needs assistance Is this a change from baseline?: Pre-admission baseline Feeding: Needs assistance Is this a change from baseline?: Pre-admission baseline Bathing: Dependent Is this a change from baseline?: Pre-admission baseline Toileting: Dependent Is this a change from baseline?: Pre-admission baseline In/Out Bed: Needs assistance Is this a change from baseline?: Pre-admission baseline Walks in Home: Dependent Is this a change from baseline?: Pre-admission baseline Does the patient have difficulty walking or climbing stairs?: Yes Weakness of Legs: Both Weakness of Arms/Hands: None  Permission Sought/Granted   Permission granted to share information with : Yes, Verbal Permission Granted  Share Information with NAME: Verlin Grills (Spouse)   [phone]           Emotional Assessment Appearance:: Appears stated age Attitude/Demeanor/Rapport: Gracious Affect (typically observed):  Accepting Orientation: : Oriented to Self, Oriented to  Time, Oriented to Place, Oriented to Situation Alcohol / Substance Use: Not Applicable Psych Involvement: No (comment)  Admission diagnosis:  Status post total replacement of left hip [Z96.642] Patient Active Problem List   Diagnosis Date Noted   Primary osteoarthritis of left hip 01/22/2022   Status post total replacement of left hip 01/22/2022   High blood cholesterol 11/06/2021   Insomnia 11/06/2021   Diabetes mellitus without complication ([place]) [id number]   Essential hypertension 07/16/2019   Primary osteoarthritis involving multiple joints 07/16/2019   Screening for prostate cancer 07/16/2019   Obstructive sleep apnea (adult) (pediatric) 02/26/2019   Morbid (severe) obesity due to excess calories ([place]) 02/26/2019   Cervical spondylosis 02/02/2019   Lumbar spondylosis 02/02/2019   Depression, unspecified 02/02/2019   Dyspnea on exertion 02/02/2019   Hand pain 02/02/2019   Chest pain with high risk for cardiac etiology 02/02/2019   Hip pain 02/02/2019   Knee pain 02/02/2019   Localized, primary osteoarthritis 02/02/2019   Low back pain 02/02/2019   Neck pain 02/02/2019   Osteoarthritis of knee 02/02/2019   PCP:  Leone Haven, MD Pharmacy:   [facility] [phone] - [place], [address] [address] Phone: [phone] Fax: [phone]     Social Determinants of Health (SDOH) Interventions    Readmission Risk Interventions No flowsheet data found.

## 2022-01-23 NOTE — Progress Notes (Addendum)
Physical Therapy Treatment Patient Details Name: Kent Rodriguez MRN: [id number] DOB: March 02, 1946 Today's Date: 01/23/2022   History of Present Illness Patient is 76 y.o. male s/p Lt THA anterior approach on 01/22/22 with PMH significant for COPD, DM, depression, HLD, HTN, OA, back pain, Lt TKA in 2005.    PT Comments    Patient remained up in recliner until second therapy session. Pt demonstrated good recall for bil UE use to power up from recliner with min assist to rise and steady. Mod+2 for safety to ambulate short distance to move to bedside. BOS remains wide and pt unable to fit both LE's inside walker frame reducing ability to use UE's for pressure relief. EOS initiated LE exercises for ROM, strength, and circulation. Pt is close to mobility level PTA for surgery however is at risk for falling and would benefit greatly from ST rehab at SNF prior to return home to reduce fall risk and maximize functional independence with mobility.    Recommendations for follow up therapy are one component of a multi-disciplinary discharge planning process, led by the attending physician.  Recommendations may be updated based on patient status, additional functional criteria and insurance authorization.  Follow Up Recommendations  Skilled nursing-short term rehab (<3 hours/day)     Assistance Recommended at Discharge Frequent or constant Supervision/Assistance  Patient can return home with the following A lot of help with walking and/or transfers;A lot of help with bathing/dressing/bathroom;Assistance with cooking/housework;Assist for transportation;Help with stairs or ramp for entrance   Equipment Recommendations  Other (comment) (wide RW if pt qualifies)    Recommendations for Other Services       Precautions / Restrictions Precautions Precautions: Fall Precaution Comments: 3 in last year Restrictions Weight Bearing Restrictions: No Other Position/Activity Restrictions: WBAT      Mobility  Bed Mobility Overal bed mobility: Needs Assistance Bed Mobility: Sit to Supine     Supine to sit: Min assist, HOB elevated Sit to supine: Min assist   General bed mobility comments: pt completed lateral scoot/side step at EOB with min assist to move closer to [facility] prior to return to supine. Min assist to raise LE's back onto bed, pt initiated lifting Rt LE onto bed. pt abel to boost superior in ebd with bil UE on headboard and bridging with bil LE's.    Transfers Overall transfer level: Needs assistance Equipment used: Rolling walker (2 wheels) Transfers: Sit to/from Stand Sit to Stand: Min assist, +2 safety/equipment           General transfer comment: pt required bil UE's on armrests to power up, min assist to steady but pt initiated rise himself.    Ambulation/Gait Ambulation/Gait assistance: Mod assist, +2 safety/equipment, Min assist Gait Distance (Feet): 10 Feet Assistive device: Rolling walker (2 wheels) Gait Pattern/deviations: Step-through pattern, Decreased step length - right, Decreased step length - left, Decreased stride length, Trunk flexed, Wide base of support, Knees buckling Gait velocity: decr     General Gait Details: pt with flexed posture and bil knees flexed with gait, no buckling this session. pt has very wide stance limited by hip ROM Rt>Lt. Mod assist needed to sequence turn and step pattern to RW ambulate short distance and turn to sit on EOB.   Stairs             Wheelchair Mobility    Modified Rankin (Stroke Patients Only)       Balance Overall balance assessment: Needs assistance Sitting-balance support: Feet supported, Bilateral upper extremity supported Sitting  balance-Leahy Scale: Fair     Standing balance support: During functional activity, Reliant on assistive device for balance, Bilateral upper extremity supported Standing balance-Leahy Scale: Poor Standing balance comment: heavy reliance on external support  of RW and therapist, WBOS                            Cognition Arousal/Alertness: Awake/alert Behavior During Therapy: WFL for tasks assessed/performed Overall Cognitive Status: Within Functional Limits for tasks assessed                                          Exercises Total Joint Exercises Ankle Circles/Pumps: AROM, Both, 10 reps, Supine Short Arc Quad: AROM, Left, 10 reps, Supine Heel Slides: AROM, Left, 5 reps, Supine    General Comments        Pertinent Vitals/Pain Pain Assessment Pain Assessment: Faces Faces Pain Scale: Hurts little more Pain Location: Lt hip Pain Descriptors / Indicators: Aching, Discomfort, Guarding Pain Intervention(s): Limited activity within patient's tolerance, Monitored during session, Repositioned, Ice applied    Home Living                          Prior Function            PT Goals (current goals can now be found in the care plan section) Acute Rehab PT Goals Patient Stated Goal: be able to walk slightly longer distances and prepare for Rt THA and TKA PT Goal Formulation: With patient Time For Goal Achievement: 01/30/22 Potential to Achieve Goals: Good Progress towards PT goals: Progressing toward goals    Frequency    7X/week      PT Plan Current plan remains appropriate    Co-evaluation PT/OT/SLP Co-Evaluation/Treatment: Yes            AM-PAC PT "6 Clicks" Mobility   Outcome Measure  Help needed turning from your back to your side while in a flat bed without using bedrails?: A Little Help needed moving from lying on your back to sitting on the side of a flat bed without using bedrails?: A Little Help needed moving to and from a bed to a chair (including a wheelchair)?: A Lot Help needed standing up from a chair using your arms (e.g., wheelchair or bedside chair)?: A Little Help needed to walk in hospital room?: A Lot Help needed climbing 3-5 steps with a railing? : Total 6  Click Score: 14    End of Session Equipment Utilized During Treatment: Gait belt Activity Tolerance: Patient tolerated treatment well Patient left: in chair;with call bell/phone within reach Nurse Communication: Mobility status PT Visit Diagnosis: Muscle weakness (generalized) (M62.81);Difficulty in walking, not elsewhere classified (R26.2);Unsteadiness on feet (R26.81)     Time: 3267-1245 PT Time Calculation (min) (ACUTE ONLY): 25 min  Charges:  $Gait Training: 8-22 mins $Therapeutic Exercise: 8-22 mins                     Kent Rodriguez, DPT Acute Rehabilitation Services Office [phone] Pager [phone]    Anitra Lauth 01/23/2022, 2:29 PM

## 2022-01-23 NOTE — Plan of Care (Signed)

## 2022-01-23 NOTE — [place] FL2 (Signed)
MEDICAID FL2 LEVEL OF CARE SCREENING TOOL     IDENTIFICATION  Patient Name: Kent Rodriguez Birthdate: 07/11/1946 Sex: male Admission Date (Current Location): 01/22/2022  [facility] and [place] Number:  Producer, television/film/video and Address:  [facility] [place], [address] [place], [place] [address]      Provider Number: [id number]  Attending Physician Name and Address:  Tarry Kos, MD  Relative Name and Phone Number:  Charma Igo [phone]    Current Level of Care: Hospital Recommended Level of Care: Skilled Nursing Facility Prior Approval Number:    Date Approved/Denied:   PASRR Number:    Discharge Plan: SNF    Current Diagnoses: Patient Active Problem List   Diagnosis Date Noted   Primary osteoarthritis of left hip 01/22/2022   Status post total replacement of left hip 01/22/2022   High blood cholesterol 11/06/2021   Insomnia 11/06/2021   Diabetes mellitus without complication (HCC) 07/16/2019   Essential hypertension 07/16/2019   Primary osteoarthritis involving multiple joints 07/16/2019   Screening for prostate cancer 07/16/2019   Obstructive sleep apnea (adult) (pediatric) 02/26/2019   Morbid (severe) obesity due to excess calories (HCC) 02/26/2019   Cervical spondylosis 02/02/2019   Lumbar spondylosis 02/02/2019   Depression, unspecified 02/02/2019   Dyspnea on exertion 02/02/2019   Hand pain 02/02/2019   Chest pain with high risk for cardiac etiology 02/02/2019   Hip pain 02/02/2019   Knee pain 02/02/2019   Localized, primary osteoarthritis 02/02/2019   Low back pain 02/02/2019   Neck pain 02/02/2019   Osteoarthritis of knee 02/02/2019    Orientation RESPIRATION BLADDER Height & Weight     Self, Time, Situation, Place  Normal Continent Weight: 240 lb (108.9 kg) Height:  5\' 6"  (167.6 cm)  BEHAVIORAL SYMPTOMS/MOOD NEUROLOGICAL BOWEL NUTRITION STATUS      Continent Diet (see discharge summary)   AMBULATORY STATUS COMMUNICATION OF NEEDS Skin   Limited Assist Verbally Normal                       Personal Care Assistance Level of Assistance  Bathing, Feeding, Dressing Bathing Assistance: Maximum assistance Feeding assistance: Limited assistance Dressing Assistance: Maximum assistance     Functional Limitations Info  Sight, Hearing, Speech Sight Info: Adequate Hearing Info: Adequate Speech Info: Adequate    SPECIAL CARE FACTORS FREQUENCY  PT (By licensed PT), OT (By licensed OT)     PT Frequency: 5x week OT Frequency: 5x week            Contractures Contractures Info: Not present    Additional Factors Info  Code Status, Allergies Code Status Info: full Allergies Info: Oxycodone, Gabapentin           Current Medications (01/23/2022):  This is the current hospital active medication list Current Facility-Administered Medications  Medication Dose Route Frequency Provider Last Rate Last Admin   0.9 %  sodium chloride infusion   Intravenous Continuous 01/25/2022, MD 75 mL/hr at 01/23/22 1249 Restarted at 01/23/22 1249   acetaminophen (TYLENOL) tablet 325-650 mg  325-650 mg Oral Q6H PRN 01/25/22, MD       alum & mag hydroxide-simeth (MAALOX/MYLANTA) 200-200-20 MG/5ML suspension 30 mL  30 mL Oral Q4H PRN 06-11-2001, MD       aspirin chewable tablet 81 mg  81 mg Oral BID Tarry Kos, MD   81 mg at 01/23/22 0935   diphenhydrAMINE (BENADRYL) 12.5 MG/5ML elixir  25 mg  25 mg Oral Q4H PRN Tarry Kos, MD       docusate sodium (COLACE) capsule 100 mg  100 mg Oral BID Tarry Kos, MD   100 mg at 01/23/22 0935   lisinopril (ZESTRIL) tablet 10 mg  10 mg Oral Daily Leander Rams, RPH   10 mg at 01/22/22 2536   And   hydrochlorothiazide (HYDRODIURIL) tablet 12.5 mg  12.5 mg Oral Daily Leander Rams, RPH   12.5 mg at 01/22/22 1809   HYDROcodone-acetaminophen (NORCO) 7.5-325 MG per tablet 1-2 tablet  1-2 tablet Oral Q4H PRN Tarry Kos, MD   2 tablet at  01/22/22 6440   HYDROcodone-acetaminophen (NORCO/VICODIN) 5-325 MG per tablet 1-2 tablet  1-2 tablet Oral Q4H PRN Tarry Kos, MD       menthol-cetylpyridinium (CEPACOL) lozenge 3 mg  1 lozenge Oral PRN Tarry Kos, MD       Or   phenol (CHLORASEPTIC) mouth spray 1 spray  1 spray Mouth/Throat PRN Tarry Kos, MD       metFORMIN (GLUCOPHAGE-XR) 24 hr tablet 1,000 mg  1,000 mg Oral QPM Tarry Kos, MD   1,000 mg at 01/22/22 1809   methocarbamol (ROBAXIN) tablet 500 mg  500 mg Oral Q6H PRN Tarry Kos, MD       Or   methocarbamol (ROBAXIN) 500 mg in dextrose 5 % 50 mL IVPB  500 mg Intravenous Q6H PRN Tarry Kos, MD       metoCLOPramide (REGLAN) tablet 5-10 mg  5-10 mg Oral Q8H PRN Tarry Kos, MD       Or   metoCLOPramide (REGLAN) injection 5-10 mg  5-10 mg Intravenous Q8H PRN Tarry Kos, MD       metoprolol succinate (TOPROL-XL) 24 hr tablet 50 mg  50 mg Oral Daily Tarry Kos, MD       morphine (MS CONTIN) 12 hr tablet 30 mg  30 mg Oral Q12H Tarry Kos, MD   30 mg at 01/23/22 3474   morphine (MSIR) tablet 15 mg  15 mg Oral BID PRN Tarry Kos, MD       morphine (PF) 2 MG/ML injection 0.5-1 mg  0.5-1 mg Intravenous Q2H PRN Tarry Kos, MD       naloxone [facility]) injection 0.4 mg  0.4 mg Intravenous PRN Leander Rams, RPH       ondansetron [facility]) tablet 4 mg  4 mg Oral Q6H PRN Tarry Kos, MD       Or   ondansetron [facility]) injection 4 mg  4 mg Intravenous Q6H PRN Tarry Kos, MD       pantoprazole (PROTONIX) EC tablet 40 mg  40 mg Oral Daily Tarry Kos, MD   40 mg at 01/23/22 0935   polyethylene glycol (MIRALAX / GLYCOLAX) packet 17 g  17 g Oral Daily Tarry Kos, MD   17 g at 01/23/22 0935   sorbitol 70 % solution 30 mL  30 mL Oral Daily PRN Tarry Kos, MD       tranexamic acid (CYKLOKAPRON) IVPB 1,000 mg  1,000 mg Intravenous Once Tarry Kos, MD         Discharge Medications: Please see discharge summary for a list of discharge  medications.  Relevant Imaging Results:  Relevant Lab Results:   Additional Information SSN: [id number]  Lorri Frederick, LCSW

## 2022-01-23 NOTE — Discharge Summary (Addendum)
Patient ID: Kent Rodriguez MRN: [id number] DOB/AGE: 74-Jan-1947 74 y.o.  Admit date: 01/22/2022 Discharge date: 01/24/2022  Admission Diagnoses:  Principal Problem:   Primary osteoarthritis of left hip Active Problems:   Status post total replacement of left hip   Discharge Diagnoses:  Same  Past Medical History:  Diagnosis Date   Back pain    lower back   COPD (chronic obstructive pulmonary disease) ([place])    Dependent on wheelchair    Depression    Diabetes mellitus without complication ([place])    type 2   HLD (hyperlipidemia)    Hypertension    Insomnia    Osteoarthritis of left hip    Seasonal allergies    Sleep apnea    uses CPAP nightly   Spondylolysis of cervical region    Spondylolysis of lumbar region     Surgeries: Procedure(s): LEFT TOTAL HIP ARTHROPLASTY ANTERIOR APPROACH on 01/22/2022   Consultants:   Discharged Condition: Improved  Hospital Course: Kent Rodriguez is an 76 y.o. male who was admitted 01/22/2022 for operative treatment ofPrimary osteoarthritis of left hip. Patient has severe unremitting pain that affects sleep, daily activities, and work/hobbies. After pre-op clearance the patient was taken to the operating room on 01/22/2022 and underwent  Procedure(s): LEFT TOTAL HIP ARTHROPLASTY ANTERIOR APPROACH.    Patient was given perioperative antibiotics:  Anti-infectives (From admission, onward)    Start     Dose/Rate Route Frequency Ordered Stop   01/22/22 1830  ceFAZolin (ANCEF) IVPB 2g/100 mL premix        2 g 200 mL/hr over 30 Minutes Intravenous Every 6 hours 01/22/22 1403 01/23/22 0700   01/22/22 1246  vancomycin (VANCOCIN) powder  Status:  Discontinued          As needed 01/22/22 1247 01/22/22 1402   01/22/22 1015  ceFAZolin (ANCEF) IVPB 2g/100 mL premix        2 g 200 mL/hr over 30 Minutes Intravenous On call to O.R. 01/22/22 1000 01/22/22 1231        Patient was given sequential compression devices, early ambulation, and  chemoprophylaxis to prevent DVT.  Patient benefited maximally from hospital stay and there were no complications.    Recent vital signs: Patient Vitals for the past 24 hrs:  BP Temp Temp src Pulse Resp SpO2  01/24/22 0745 118/65 98 F (36.7 C) Oral 67 19 96 %  01/24/22 0403 115/62 (!) 97.3 F (36.3 C) Oral 69 18 97 %  01/23/22 1932 (!) 108/50 98.2 F (36.8 C) Oral 92 18 94 %     Recent laboratory studies:  Recent Labs    01/23/22 0336  WBC 11.8*  HGB 12.8*  HCT 37.7*  PLT 245  NA 138  K 4.7  CL 102  CO2 26  BUN 24*  CREATININE 1.34*  GLUCOSE 123*  CALCIUM 8.7*     Discharge Medications:   Allergies as of 01/24/2022       Reactions   Oxycodone Anxiety, Palpitations, Shortness Of Breath   Gabapentin Rash        Medication List     STOP taking these medications    Fish Oil 1200 MG Caps   meloxicam 15 MG tablet Commonly known as: MOBIC       TAKE these medications    aspirin EC 81 MG tablet Take 1 tablet (81 mg total) by mouth 2 (two) times daily. To be taken after surgery to prevent blood clots   cholecalciferol 25 MCG (1000 UNIT) tablet Commonly  known as: VITAMIN D3 Take 1,000 Units by mouth daily.   docusate sodium 100 MG capsule Commonly known as: Colace Take 1 capsule (100 mg total) by mouth daily as needed.   fluticasone 50 MCG/ACT nasal spray Commonly known as: FLONASE Place 1 spray into both nostrils daily as needed for allergies or rhinitis.   HYDROcodone-acetaminophen 7.5-325 MG tablet Commonly known as: Norco Take 1-2 tablets by mouth every 6 (six) hours as needed for moderate pain.   lisinopril-hydrochlorothiazide 10-12.5 MG tablet Commonly known as: ZESTORETIC Take 1 tablet by mouth daily.   metFORMIN 500 MG 24 hr tablet Commonly known as: GLUCOPHAGE-XR Take 1,000 mg by mouth every evening.   methocarbamol 500 MG tablet Commonly known as: Robaxin Take 1 tablet (500 mg total) by mouth 2 (two) times daily as needed.    metoprolol succinate 50 MG 24 hr tablet Commonly known as: TOPROL-XL Take 50 mg by mouth daily.   morphine 15 MG tablet Commonly known as: MSIR Take 15 mg by mouth 2 (two) times daily as needed for severe pain.   morphine 30 MG 12 hr tablet Commonly known as: MS CONTIN Take 30 mg by mouth every 12 (twelve) hours.   naloxone 4 MG/0.1ML Liqd nasal spray kit Commonly known as: NARCAN Place 1 spray into the nose as needed (opiate overdose).   nystatin-triamcinolone cream Commonly known as: MYCOLOG II Apply 1 application topically 2 (two) times daily as needed for rash.   ondansetron 4 MG tablet Commonly known as: Zofran Take 1 tablet (4 mg total) by mouth every 8 (eight) hours as needed for nausea or vomiting.   simvastatin 20 MG tablet Commonly known as: ZOCOR Take 20 mg by mouth at bedtime.   sulfamethoxazole-trimethoprim 800-160 MG tablet Commonly known as: BACTRIM DS Take 1 tablet by mouth 2 (two) times daily. To be taken after surgery   traZODone 50 MG tablet Commonly known as: DESYREL Take 50 mg by mouth at bedtime as needed for sleep.   zolpidem 10 MG tablet Commonly known as: AMBIEN Take 10 mg by mouth at bedtime.               Durable Medical Equipment  (From admission, onward)           Start     Ordered   01/22/22 1635  DME Walker rolling  Once       Question:  Patient needs a walker to treat with the following condition  Answer:  History of hip replacement   01/22/22 1634   01/22/22 1635  DME 3 n 1  Once        01/22/22 1634   01/22/22 1635  DME Bedside commode  Once       Question:  Patient needs a bedside commode to treat with the following condition  Answer:  History of hip replacement   01/22/22 1634            Diagnostic Studies: DG Pelvis Portable  Result Date: 01/22/2022 CLINICAL DATA:  Postop left hip replacement. EXAM: PORTABLE PELVIS 1-2 VIEWS COMPARISON:  None. FINDINGS: Left hip arthroplasty in expected alignment. No  periprosthetic lucency or fracture. Recent postsurgical change includes air and edema in the soft tissues. Advanced right hip arthropathy is partially included. IMPRESSION: Left hip arthroplasty without immediate postoperative complication. Electronically Signed   By: Keith Rake M.D.   On: 01/22/2022 15:35   DG C-Arm 1-60 Min-No Report  Result Date: 01/22/2022 CLINICAL DATA:  LEFT anterior total hip arthroplasty.  EXAM: DG HIP (WITH OR WITHOUT PELVIS) 1V*L*; DG C-ARM 1-60 MIN-NO REPORT COMPARISON:  10/24/2021 FINDINGS: Interval LEFT hip arthroplasty. Hardware appears well seated and located on the frontal view provided. No interval fracture. IMPRESSION: Interval LEFT hip arthroplasty.  No adverse features. Electronically Signed   By: Nolon Nations M.D.   On: 01/22/2022 13:50   DG C-Arm 1-60 Min-No Report  Result Date: 01/22/2022 CLINICAL DATA:  LEFT anterior total hip arthroplasty. EXAM: DG HIP (WITH OR WITHOUT PELVIS) 1V*L*; DG C-ARM 1-60 MIN-NO REPORT COMPARISON:  10/24/2021 FINDINGS: Interval LEFT hip arthroplasty. Hardware appears well seated and located on the frontal view provided. No interval fracture. IMPRESSION: Interval LEFT hip arthroplasty.  No adverse features. Electronically Signed   By: Nolon Nations M.D.   On: 01/22/2022 13:50   DG HIP UNILAT WITH PELVIS 1V LEFT  Result Date: 01/22/2022 CLINICAL DATA:  LEFT anterior total hip arthroplasty. EXAM: DG HIP (WITH OR WITHOUT PELVIS) 1V*L*; DG C-ARM 1-60 MIN-NO REPORT COMPARISON:  10/24/2021 FINDINGS: Interval LEFT hip arthroplasty. Hardware appears well seated and located on the frontal view provided. No interval fracture. IMPRESSION: Interval LEFT hip arthroplasty.  No adverse features. Electronically Signed   By: Nolon Nations M.D.   On: 01/22/2022 13:50    Disposition: Discharge disposition: 01-Home or Self Care          Follow-up Information     Leandrew Koyanagi, MD. Schedule an appointment as soon as possible for a  visit in 1 week(s).   Specialty: Orthopedic Surgery Contact information: [address] [place] [address] [phone]                  Signed: Aundra Dubin 01/24/2022, 12:52 PM

## 2022-01-23 NOTE — Care Management Obs Status (Signed)
[place] NOTIFICATION   Patient Details  Name: Maysen Kreikemeier MRN: [id number] Date of Birth: [birth year]   Medicare Observation Status Notification Given:  Yes    Joanne Chars, LCSW 01/23/2022, 3:55 PM

## 2022-01-23 NOTE — Progress Notes (Signed)
Patient did well today with PT.  Has noticed significant improvement in hip function.  Will need short term stay at SNF.  They are hoping to get into [employer]  FL-2 signed.    Mayra Reel, MD [facility] 7:24 PM

## 2022-01-24 ENCOUNTER — Other Ambulatory Visit: Payer: Self-pay

## 2022-01-24 ENCOUNTER — Encounter (HOSPITAL_COMMUNITY): Payer: Self-pay | Admitting: Orthopaedic Surgery

## 2022-01-24 DIAGNOSIS — M1612 Unilateral primary osteoarthritis, left hip: Secondary | ICD-10-CM

## 2022-01-24 NOTE — TOC Progression Note (Addendum)
Transition of Care [facility]) - Progression Note    Patient Details  Name: Kent Rodriguez MRN: [id number] Date of Birth: 1946-06-20  Transition of [facility] CM/SW Contact  Joanne Chars, LCSW Phone Number: 01/24/2022, 9:55 AM  Clinical Narrative:   CSW LM with Tammy at [facility] for update on SNF auth.  Some confusion regarding insurance: pt has BCBS coverage through his spouse, pt also provided CSW with medicare card, part A only, which was faxed yesterday to [facility].  1030: TC Tammy, [employer].  Pt medicare part A does not cover SNF, just acute care.  They have had several other cases like this and the commercial insurance has not ended up paying.  She has reviewed with her director and they cannot accept pt unless pt can self pay, 21 days would be $10,500.  CSW spoke with pt, who spoke with husband and they asked if they could come for 7-10 days, LM with Tammy.  1140: TC Tammy, she has not heard a final answer from director, but therapy dept not comfortable with shorter stay and she anticipates this will not be approved.  Will call back to confirm.  CSW spoke with pt and with husband, they want to change plan to home with [facility].    Expected Discharge Plan: [place] Barriers to Discharge: Continued Medical Work up, SNF Pending bed offer  Expected Discharge Plan and Services Expected Discharge Plan: [place]   Discharge Planning Services: CM Consult   Living arrangements for the past 2 months: Single Family Home Expected Discharge Date: 01/24/22                                     Social Determinants of Health (SDOH) Interventions    Readmission Risk Interventions No flowsheet data found.

## 2022-01-24 NOTE — Progress Notes (Signed)
Physical Therapy Treatment Patient Details Name: Kent Rodriguez MRN: [id number] DOB: 1946/10/06 Today's Date: 01/24/2022   History of Present Illness Patient is 76 y.o. male s/p Lt THA anterior approach on 01/22/22 with PMH significant for COPD, DM, depression, HLD, HTN, OA, back pain, Lt TKA in 2005.    PT Comments    Pt received in bathroom sitting on toilet, eager to initiate further gait training and bariatric RW provided this session to ensure safe DME recommendation. Pt with improved BOS and stability using bariatric RW, but continues to require frequent safety cueing to prevent poor body mechanics. Pt with increased fall risk due to difficulty maintaining proximity to RW, pain and decreased endurance, with notable dyspnea and dizziness with exertion, unable to perform longer household distance gait task so will need consistent supervision/assist for functional mobility tasks including bed mobility, transfers and gait. Pt able to progress gait distance to 23ft prior to needing to sit due to symptoms of lightheadedness, fatigue and "feeling hot". Pt continues to benefit from PT services to progress toward functional mobility goals. Plan to progress gait training with chair follow for safety next session and continue bed mobility training from flat [facility] with no rails per home setup.   Recommendations for follow up therapy are one component of a multi-disciplinary discharge planning process, led by the attending physician.  Recommendations may be updated based on patient status, additional functional criteria and insurance authorization.  Follow Up Recommendations  Outpatient PT (HHPT would be preferable but per case mgmt notes unavailable so rec OPPT)     Assistance Recommended at Discharge Frequent or constant Supervision/Assistance  Patient can return home with the following A lot of help with walking and/or transfers;A lot of help with bathing/dressing/bathroom;Assistance with  cooking/housework;Assist for transportation;Help with stairs or ramp for entrance   Equipment Recommendations  Other (comment);Rolling walker (2 wheels);Hospital bed (Bariatric RW)    Recommendations for Other Services       Precautions / Restrictions Precautions Precautions: Fall Precaution Comments: 3 in last year Restrictions Weight Bearing Restrictions: Yes Other Position/Activity Restrictions: LLE WBAT     Mobility  Bed Mobility Overal bed mobility: Needs Assistance Bed Mobility: Supine to Sit       Sit to supine: Min assist, HOB elevated   General bed mobility comments: pt remained up in recliner    Transfers Overall transfer level: Needs assistance Equipment used: Rolling walker (2 wheels) Transfers: Sit to/from Stand Sit to Stand: Min assist           General transfer comment: pt needs mod cues for safe LE positioning and body mechanics to achieve upright, pt with extremely wide BOS upon standing and needs consistent minA for safety to steady/improve foot placement and posture; from toilet>RW and then RW>recliner    Ambulation/Gait Ambulation/Gait assistance: Min assist Gait Distance (Feet): 25 Feet Assistive device: Rolling walker (2 wheels) (bariatric) Gait Pattern/deviations: Step-through pattern, Decreased step length - right, Decreased step length - left, Decreased stride length, Wide base of support, Decreased weight shift to left Gait velocity: decr     General Gait Details: Pt with very wide stance limited by hip ROM Rt>Lt. Mod cues needed to sequence steps within RW, especially while turning; decreased carryover of cues, but pt safer in second session using bariatric RW   Stairs             Wheelchair Mobility    Modified Rankin (Stroke Patients Only)       Balance Overall balance assessment: Needs  assistance Sitting-balance support: Feet supported, Bilateral upper extremity supported Sitting balance-Leahy Scale: Fair      Standing balance support: During functional activity, Reliant on assistive device for balance, Bilateral upper extremity supported Standing balance-Leahy Scale: Poor Standing balance comment: improved with bariatric RW                            Cognition Arousal/Alertness: Awake/alert Behavior During Therapy: WFL for tasks assessed/performed Overall Cognitive Status: Within Functional Limits for tasks assessed                                 General Comments: Poor safety awareness, needs mod cues throughout due to decreased carryover of cues.        Exercises Total Joint Exercises Ankle Circles/Pumps: AROM, Both, 10 reps, Supine Quad Sets: AROM, 5 reps, Supine Hip ABduction/ADduction: AAROM, Both, 5 reps, Supine    General Comments General comments (skin integrity, edema, etc.): Pt c/o dizzines/"feeling hot" midway through gait trial, returned to chair and BP checked, noted to be stable. SpO2 with poor signal, appears WFL (92%) when good waveform achieved      Pertinent Vitals/Pain Pain Assessment Pain Assessment: 0-10 Pain Score: 7  Pain Location: Lt hip Pain Descriptors / Indicators: Aching, Discomfort, Guarding Pain Intervention(s): Limited activity within patient's tolerance, Monitored during session, Repositioned, Premedicated before session, Ice applied (iceman placed at end of session, refilled for him)     PT Goals (current goals can now be found in the care plan section) Acute Rehab PT Goals Patient Stated Goal: be able to walk slightly longer distances and prepare for Rt THA and TKA PT Goal Formulation: With patient Time For Goal Achievement: 01/30/22 Progress towards PT goals: Progressing toward goals    Frequency    7X/week      PT Plan Current plan remains appropriate       AM-PAC PT "6 Clicks" Mobility   Outcome Measure  Help needed turning from your back to your side while in a flat bed without using bedrails?: A  Little Help needed moving from lying on your back to sitting on the side of a flat bed without using bedrails?: A Lot Help needed moving to and from a bed to a chair (including a wheelchair)?: A Little Help needed standing up from a chair using your arms (e.g., wheelchair or bedside chair)?: A Little Help needed to walk in hospital room?: A Lot (mod cues) Help needed climbing 3-5 steps with a railing? : A Lot 6 Click Score: 15    End of Session Equipment Utilized During Treatment: Gait belt Activity Tolerance: Patient tolerated treatment well;Treatment limited secondary to medical complications (Comment) (pt with dizziness and c/o "feeling hot", standing/sitting BP stable however) Patient left: in chair;with call bell/phone within reach;with nursing/sitter in room (NT entering room to assist him with bath; pt up in recliner) Nurse Communication: Mobility status PT Visit Diagnosis: Muscle weakness (generalized) (M62.81);Difficulty in walking, not elsewhere classified (R26.2);Unsteadiness on feet (R26.81)     Time: 6754-4920 PT Time Calculation (min) (ACUTE ONLY): 29 min  Charges:  $Gait Training: 23-37 mins                     Payten Hobin P., PTA Acute Rehabilitation Services Pager: [phone] Office: [phone]    [place] 01/24/2022, 2:13 PM

## 2022-01-24 NOTE — Progress Notes (Addendum)
Physical Therapy Treatment Patient Details Name: Kent Rodriguez MRN: [id number] DOB: August 21, 1946 Today's Date: 01/24/2022   History of Present Illness Patient is 76 y.o. male s/p Lt THA anterior approach on 01/22/22 with PMH significant for COPD, DM, depression, HLD, HTN, OA, back pain, Lt TKA in 2005.    PT Comments    Pt received in supine, eager to participate in PT session, urgent to get to bathroom and at times distracted due to this. Pt needing mod cues for safety/sequencing of movements for bed mobility/transfers and increased time to perform. Pt heavily reliant on bed rails and reports his bed at home has none; pt would benefit from hospital bed due to body habitus and post-op pain for improved independence with bed mobility; if unavailable, he would benefit from rails installed on his bed at home for ease of egress. Pt needing at least minA but maximal cues for technique with transfers and gait initiation. Pt using standard RW this session but very unsafe with it due to wide BOS and would benefit from bariatric RW, discussed with case mgmt and pt, pt agreeable, DME recommendations and disposition updated below per extensive conversation with pt, case mgmt and supervising PT Mickel Baas K as per chart review, SNF and HHPT not currently available.   Recommendations for follow up therapy are one component of a multi-disciplinary discharge planning process, led by the attending physician.  Recommendations may be updated based on patient status, additional functional criteria and insurance authorization.  Follow Up Recommendations  Outpatient PT (HHPT would be preferable but per case mgmt notes unavailable so rec OPPT)     Assistance Recommended at Discharge Frequent or constant Supervision/Assistance  Patient can return home with the following A lot of help with walking and/or transfers;A lot of help with bathing/dressing/bathroom;Assistance with cooking/housework;Assist for transportation;Help  with stairs or ramp for entrance   Equipment Recommendations  Other (comment);Rolling walker (2 wheels);Hospital bed (Bariatric RW)    Recommendations for Other Services       Precautions / Restrictions Precautions Precautions: Fall Precaution Comments: 3 in last year Restrictions Weight Bearing Restrictions: No Other Position/Activity Restrictions: WBAT     Mobility  Bed Mobility Overal bed mobility: Needs Assistance Bed Mobility: Supine to Sit       Sit to supine: Min assist, HOB elevated   General bed mobility comments: heavy reliance on bed rails and bed features and increased effort to perform    Transfers Overall transfer level: Needs assistance Equipment used: Rolling walker (2 wheels) Transfers: Sit to/from Stand Sit to Stand: Min assist, +2 safety/equipment           General transfer comment: pt needs mod cues for safe LE positioning and body mechanics to achieve upright, pt with extremely wide BOS upon standing and needs consistent minA for safety to steady/improve foot placement and posture; stand>sit to [facility] however pt reports fit is uncomfortable, needs minA to stand from elevated BSC and lowering assist to sit on lower toilet seat    Ambulation/Gait Ambulation/Gait assistance: Min assist, +2 safety/equipment Gait Distance (Feet): 15 Feet Assistive device: Rolling walker (2 wheels) Gait Pattern/deviations: Step-through pattern, Decreased step length - right, Decreased step length - left, Decreased stride length, Wide base of support, Decreased weight shift to left Gait velocity: decr     General Gait Details: Pt with very wide stance limited by hip ROM Rt>Lt. Mod cues needed to sequence steps within RW, especially while turning; decreased carryover of cues   Stairs  Wheelchair Mobility    Modified Rankin (Stroke Patients Only)       Balance Overall balance assessment: Needs assistance Sitting-balance support: Feet supported,  Bilateral upper extremity supported Sitting balance-Leahy Scale: Fair     Standing balance support: During functional activity, Reliant on assistive device for balance, Bilateral upper extremity supported Standing balance-Leahy Scale: Poor Standing balance comment: heavy reliance on RW, some posterior/lateral LOB despite RW                            Cognition Arousal/Alertness: Awake/alert Behavior During Therapy: WFL for tasks assessed/performed Overall Cognitive Status: Within Functional Limits for tasks assessed                                 General Comments: Poor safety awareness, needs mod cues throughout due to decreased carryover of cues.        Exercises      General Comments General comments (skin integrity, edema, etc.): SpO2 WFL on RA, no acute s/sx distress; mobility tech in room for safety and line mgmt with wound vac      Pertinent Vitals/Pain Pain Assessment Pain Assessment: 0-10 Pain Score: 7  Pain Location: Lt hip Pain Descriptors / Indicators: Aching, Discomfort, Guarding Pain Intervention(s): Limited activity within patient's tolerance, Monitored during session, Premedicated before session, Repositioned     PT Goals (current goals can now be found in the care plan section) Acute Rehab PT Goals Patient Stated Goal: be able to walk slightly longer distances and prepare for Rt THA and TKA PT Goal Formulation: With patient Time For Goal Achievement: 01/30/22 Progress towards PT goals: Progressing toward goals    Frequency    7X/week      PT Plan Discharge plan needs to be updated;Equipment recommendations need to be updated       AM-PAC PT "6 Clicks" Mobility   Outcome Measure  Help needed turning from your back to your side while in a flat bed without using bedrails?: A Little Help needed moving from lying on your back to sitting on the side of a flat bed without using bedrails?: A Lot Help needed moving to and  from a bed to a chair (including a wheelchair)?: A Little Help needed standing up from a chair using your arms (e.g., wheelchair or bedside chair)?: A Little Help needed to walk in hospital room?: A Lot (mod cues) Help needed climbing 3-5 steps with a railing? : A Lot 6 Click Score: 15    End of Session Equipment Utilized During Treatment: Gait belt Activity Tolerance: Patient tolerated treatment well Patient left: in chair;with call bell/phone within reach (pt sitting on toilet, requesting some time, aware of pull cord in bathroom once done) Nurse Communication: Mobility status PT Visit Diagnosis: Muscle weakness (generalized) (M62.81);Difficulty in walking, not elsewhere classified (R26.2);Unsteadiness on feet (R26.81)     Time: [id number] PT Time Calculation (min) (ACUTE ONLY): 17 min  Charges:  $Therapeutic Activity: 8-22 mins                     Saamir Armstrong P., PTA Acute Rehabilitation Services Pager: [phone] Office: [place] 01/24/2022, 1:58 PM

## 2022-01-24 NOTE — Progress Notes (Signed)
°  °  Durable Medical Equipment  (From admission, onward)           Start     Ordered   01/24/22 1422  For home use only DME Hospital bed  Once       Question Answer Comment  Length of Need 6 Months   Patient has (list medical condition): s/o LEFT TOTAL HIP ARTHROPLASTY, w/c dependent   The above medical condition requires: Patient requires the ability to reposition frequently   Head must be elevated greater than: 30 degrees   Bed type Semi-electric   Support Surface: Gel Overlay      01/24/22 1423   01/24/22 1403  For home use only DME Walker rolling  Once       Comments: Bariatric. Pt to pay the difference.  Question Answer Comment  Walker: With 5 Inch Wheels   Patient needs a walker to treat with the following condition Weakness      01/24/22 1403   01/22/22 1635  DME Walker rolling  Once       Question:  Patient needs a walker to treat with the following condition  Answer:  History of hip replacement   01/22/22 1634   01/22/22 1635  DME 3 n 1  Once        01/22/22 1634   01/22/22 1635  DME Bedside commode  Once       Question:  Patient needs a bedside commode to treat with the following condition  Answer:  History of hip replacement   01/22/22 1634

## 2022-01-24 NOTE — Progress Notes (Signed)
Subjective: 2 Days Post-Op Procedure(s) (LRB): LEFT TOTAL HIP ARTHROPLASTY ANTERIOR APPROACH (Left) Patient reports pain as mild.    Objective: Vital signs in last 24 hours: Temp:  [97.3 F (36.3 C)-98.4 F (36.9 C)] 98 F (36.7 C) (02/22 0745) Pulse Rate:  [67-92] 67 (02/22 0745) Resp:  [17-19] 19 (02/22 0745) BP: (107-118)/(50-65) 118/65 (02/22 0745) SpO2:  [94 %-97 %] 96 % (02/22 0745)  Intake/Output from previous day: 02/21 0701 - 02/22 0700 In: -  Out: 1500 [Urine:1500] Intake/Output this shift: No intake/output data recorded.  Recent Labs    01/23/22 0336  HGB 12.8*   Recent Labs    01/23/22 0336  WBC 11.8*  RBC 4.11*  HCT 37.7*  PLT 245   Recent Labs    01/23/22 0336  NA 138  K 4.7  CL 102  CO2 26  BUN 24*  CREATININE 1.34*  GLUCOSE 123*  CALCIUM 8.7*   No results for input(s): LABPT, INR in the last 72 hours.  Neurologically intact Neurovascular intact Sensation intact distally Intact pulses distally Dorsiflexion/Plantar flexion intact Incision: dressing C/D/I No cellulitis present Compartment soft Wound vac in place with good seal- no fluid in canister   Assessment/Plan: 2 Days Post-Op Procedure(s) (LRB): LEFT TOTAL HIP ARTHROPLASTY ANTERIOR APPROACH (Left) Advance diet Up with therapy Discharge to SNF once insurance approves and bed available.  Hopeful for tday WBAT LLE Continue wound vac- Please swap out hospital unit for portable canister upon d/c ABLA- mild and stable AKI- continue po fluids      Cristie Hem 01/24/2022, 7:56 AM

## 2022-01-24 NOTE — TOC Transition Note (Addendum)
Transition of Care [facility]) - CM/SW Discharge Note   Patient Details  Name: Kent Rodriguez MRN: [id number] Date of Birth: 02-18-1946  [facility] CM/SW Contact:  Epifanio Lesches, RN Phone Number: 01/24/2022, 2:05 PM   Clinical Narrative:    Patient will DC to: home Anticipated DC date: 01/23/2021 Family notified: yes Transport by: car       -s/p LEFT TOTAL HIP ARTHROPLASTY, 01/24/2022 Per MD patient ready for DC today. RN, patient, and patient's spouse  of aware. PA f/u with NCM regarding unable to secure home health provider. PA stated office to f/u with pt and arrange outpatient PT. Pt  requested bariatric RW, willing to pay out of pocket cost if insurance will cover. Referral made with [facility] ,[phone].Marland Kitchen Equipment will be delivered to bedside prior to d/c. Pt without Rx med concerns. Post hospital f/u noted on AVS. Spouse to provide transportation to home.  RNCM will sign off for now as intervention is no longer needed. Please consult [place] again if new needs arise.   01/23/2022 @ 1424  Pt requested hospital bed per PT's recommendation. Order placed placed. Referral made with [facility]. Bed will be delivered to pt home by [facility].  Final next level of care: Home/Self Care Barriers to Discharge: No Barriers Identified   Patient Goals and CMS Choice Patient states their goals for this hospitalization and ongoing recovery are:: get better and go home   Choice offered to / list presented to : Patient  Discharge Placement                       Discharge Plan and Services   Discharge Planning Services: CM Consult            DME Arranged: Dan Humphreys rolling, hospital bed DME Agency: [facility] Date DME Agency Contacted: 01/24/22 Time DME Agency Contacted: [phone] Representative spoke with at DME Agency: Velna Hatchet            Social Determinants of Health (SDOH) Interventions     Readmission Risk Interventions No flowsheet data  found.

## 2022-01-24 NOTE — TOC Progression Note (Signed)
Transition of Care [facility]) - Progression Note    Patient Details  Name: Kent Rodriguez MRN: [id number] Date of Birth: 01/23/1946  [facility] CM/SW Contact  Lorri Frederick, LCSW Phone Number: 01/24/2022, 9:55 AM  Clinical Narrative:   CSW LM with Tammy at [facility] for update on SNF auth.  Some confusion regarding insurance: pt has BCBS coverage through his spouse, pt also provided CSW with medicare card, part A only, which was faxed yesterday to Tammy.    Expected Discharge Plan: Skilled Nursing Facility Barriers to Discharge: Continued Medical Work up, SNF Pending bed offer  Expected Discharge Plan and Services Expected Discharge Plan: Skilled Nursing Facility   Discharge Planning Services: CM Consult   Living arrangements for the past 2 months: Single Family Home Expected Discharge Date: 01/24/22                                     Social Determinants of Health (SDOH) Interventions    Readmission Risk Interventions No flowsheet data found.

## 2022-01-24 NOTE — Progress Notes (Signed)
Patient ID: Kent Rodriguez, male   DOB: February 15, 1946, 76 y.o.   MRN: [id number]  Spoke to nurse case manager who has informed me that patient now wishes to be d/c home with hhpt.  Case manager unable to find any agency that will accept the patient as he lives in [place].  We will place an order for oppt.  He will continue with pt while inpatient.

## 2022-01-24 NOTE — TOC Progression Note (Signed)
Transition of Care [facility]) - Progression Note    Patient Details  Name: Kent Rodriguez MRN: [id number] Date of Birth: 1946-11-11  Transition of Care [facility]) CM/SW Contact  Epifanio Lesches, RN Phone Number: 01/24/2022, 12:20 PM  Clinical Narrative:    NCM spoke with pt/ spouse and confirmed pt now wants to go home with home health services. NCM called and made HHPT referral with multi home health agencies in [place]: [place] , [place], [place], [facility], [place], [facility], all declined 2/2 out of network. NCM made pt and Corrie Dandy PA ( voice message) aware.   TOC will continue to monitor and assist with needs....  Expected Discharge Plan: Home w Home Health Services Barriers to Discharge: Continued Medical Work up, SNF Pending bed offer  Expected Discharge Plan and Services Expected Discharge Plan: Home w Home Health Services   Discharge Planning Services: CM Consult   Living arrangements for the past 2 months: Single Family Home Expected Discharge Date: 01/24/22                                     Social Determinants of Health (SDOH) Interventions    Readmission Risk Interventions No flowsheet data found.

## 2022-01-30 ENCOUNTER — Ambulatory Visit (INDEPENDENT_AMBULATORY_CARE_PROVIDER_SITE_OTHER): Payer: BC Managed Care – PPO | Admitting: Physician Assistant

## 2022-01-30 ENCOUNTER — Other Ambulatory Visit: Payer: Self-pay

## 2022-01-30 DIAGNOSIS — Z96642 Presence of left artificial hip joint: Secondary | ICD-10-CM

## 2022-01-30 MED ORDER — HYDROCODONE-ACETAMINOPHEN 5-325 MG PO TABS: 1.0000 | ORAL_TABLET | Freq: Three times a day (TID) | ORAL | 0 refills | Status: DC | PRN

## 2022-01-30 NOTE — Progress Notes (Signed)
Post-Op Visit Note   Patient: Kent Rodriguez           Date of Birth: [birth year]           MRN: [id number] Visit Date: 01/30/2022 PCP: Gaspar Skeeters, MD   Assessment & Plan:  Chief Complaint:  Chief Complaint  Patient presents with   Left Hip - Follow-up   Visit Diagnoses:  1. Status post total replacement of left hip     Plan: Patient is a pleasant 76 year old gentleman who comes in today 1 week status post left total hip replacement 01/22/2022.  He is here for wound VAC removal.  He has been doing well.  He has been getting physical therapy and additionally working on home exercise program.  He is taking his medicine from pain management in addition to Norco prescribed by [place].  He has been compliant taking a baby aspirin twice daily.  Examination of his left hip reveals a well-healed surgical incision with nylon sutures in place.  No evidence of infection or cellulitis.  Calves are soft and nontender.  There is no fluid in the canister.  At this point, a new Aquacel bandage was applied.  He will continue with therapy.  Continue baby aspirin twice daily until he is 6 weeks postop.  Follow-up with [place] next week for suture removal.  Call with concerns or questions in the meantime.  Follow-Up Instructions: Return in about 1 week (around 02/06/2022).   Orders:  No orders of the defined types were placed in this encounter.  No orders of the defined types were placed in this encounter.   Imaging: No new imaging  PMFS History: Patient Active Problem List   Diagnosis Date Noted   Primary osteoarthritis of left hip 01/22/2022   Status post total replacement of left hip 01/22/2022   High blood cholesterol 11/06/2021   Insomnia 11/06/2021   Diabetes mellitus without complication (HCC) 07/16/2019   Essential hypertension 07/16/2019   Primary osteoarthritis involving multiple joints 07/16/2019   Screening for prostate cancer 07/16/2019   Obstructive sleep apnea (adult)  (pediatric) 02/26/2019   Morbid (severe) obesity due to excess calories (HCC) 02/26/2019   Cervical spondylosis 02/02/2019   Lumbar spondylosis 02/02/2019   Depression, unspecified 02/02/2019   Dyspnea on exertion 02/02/2019   Hand pain 02/02/2019   Chest pain with high risk for cardiac etiology 02/02/2019   Hip pain 02/02/2019   Knee pain 02/02/2019   Localized, primary osteoarthritis 02/02/2019   Low back pain 02/02/2019   Neck pain 02/02/2019   Osteoarthritis of knee 02/02/2019   Past Medical History:  Diagnosis Date   Back pain    lower back   COPD (chronic obstructive pulmonary disease) (HCC)    Dependent on wheelchair    Depression    Diabetes mellitus without complication (HCC)    type 2   HLD (hyperlipidemia)    Hypertension    Insomnia    Osteoarthritis of left hip    Seasonal allergies    Sleep apnea    uses CPAP nightly   Spondylolysis of cervical region    Spondylolysis of lumbar region     No family history on file.  Past Surgical History:  Procedure Laterality Date   COLONOSCOPY     several x 3   JOINT REPLACEMENT  2015   left knee   PALATE SURGERY  2008   benign   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   Procedure: LEFT TOTAL HIP ARTHROPLASTY ANTERIOR APPROACH;  Surgeon: Roda Shutters,  Edwin Cap, MD;  Location: [facility] OR;  Service: Orthopedics;  Laterality: Left;  3-C   UPPER GI ENDOSCOPY     several - stomach ulcers   Social History   Occupational History   Not on file  Tobacco Use   Smoking status: Former    Packs/day: 1.00    Years: 20.00    Pack years: 20.00    Types: Cigarettes    Quit date: 08/24/1991    Years since quitting: 30.4   Smokeless tobacco: Never  Vaping Use   Vaping Use: Never used  Substance and Sexual Activity   Alcohol use: Not Currently    Comment: wine   Drug use: Never   Sexual activity: Not Currently

## 2022-01-31 ENCOUNTER — Telehealth: Payer: Self-pay | Admitting: Orthopaedic Surgery

## 2022-01-31 NOTE — Telephone Encounter (Signed)
yes

## 2022-01-31 NOTE — Telephone Encounter (Signed)
Called pharm. Approved this per Mendel Ryder. ? ?

## 2022-01-31 NOTE — Telephone Encounter (Signed)
Pharm called and states this pt is on morphine and lindsay had sent a script for hydrocodone. Does she still want them to fill the script?  ? ?CB [phone]  ?

## 2022-02-06 ENCOUNTER — Ambulatory Visit (INDEPENDENT_AMBULATORY_CARE_PROVIDER_SITE_OTHER): Payer: BC Managed Care – PPO | Admitting: Physician Assistant

## 2022-02-06 ENCOUNTER — Other Ambulatory Visit: Payer: Self-pay

## 2022-02-06 DIAGNOSIS — Z96642 Presence of left artificial hip joint: Secondary | ICD-10-CM

## 2022-02-06 NOTE — Progress Notes (Signed)
? ?Post-Op Visit Note ?  ?Patient: Kent Rodriguez           ?Date of Birth: 03-27-1946           ?MRN: [id number] ?Visit Date: 02/06/2022 ?PCP: Gaspar Skeeters, MD ? ? ?Assessment & Plan: ? ?Chief Complaint:  ?Chief Complaint  ?Patient presents with  ? Left Hip - Pain, Routine Post Op  ? ?Visit Diagnoses:  ?1. H/O total hip arthroplasty, left   ? ? ?Plan: Patient is a pleasant 76 year old gentleman who comes in today 2 weeks status post left total hip replacement 01/22/2022.  He has been doing well.  He has been getting physical therapy twice a day where he is ambulating with a walker.  He is here today in a wheelchair.  He has minimal pain to the left hip.  He has been compliant taking his baby aspirin twice daily.  No chest pain, calf pain or shortness of breath.  Examination of the left hip reveals a well-healed surgical incision with nylon sutures in place.  No evidence of infection or cellulitis.  Calves are soft nontender.  He is neurovascular intact distally.  Today, sutures removed and Steri-Strips applied.  He will continue with physical therapy.  Continue with baby aspirin twice daily for another 4 weeks.  Dental prophylaxis reinforced.  Follow-up with [place] in 4 weeks time for repeat evaluation AP pelvis x-rays.  Anticipate checking him at 12 weeks postop to determine whether he is ready for right total hip replacement.  Call with concerns or questions in the meantime. ? ?Follow-Up Instructions: Return in about 4 weeks (around 03/06/2022).  ? ?Orders:  ?No orders of the defined types were placed in this encounter. ? ?No orders of the defined types were placed in this encounter. ? ? ?Imaging: ?No new imaging ? ?PMFS History: ?Patient Active Problem List  ? Diagnosis Date Noted  ? Primary osteoarthritis of left hip 01/22/2022  ? Status post total replacement of left hip 01/22/2022  ? High blood cholesterol 11/06/2021  ? Insomnia 11/06/2021  ? Diabetes mellitus without complication (HCC) 07/16/2019  ?  Essential hypertension 07/16/2019  ? Primary osteoarthritis involving multiple joints 07/16/2019  ? Screening for prostate cancer 07/16/2019  ? Obstructive sleep apnea (adult) (pediatric) 02/26/2019  ? Morbid (severe) obesity due to excess calories (HCC) 02/26/2019  ? Cervical spondylosis 02/02/2019  ? Lumbar spondylosis 02/02/2019  ? Depression, unspecified 02/02/2019  ? Dyspnea on exertion 02/02/2019  ? Hand pain 02/02/2019  ? Chest pain with high risk for cardiac etiology 02/02/2019  ? Hip pain 02/02/2019  ? Knee pain 02/02/2019  ? Localized, primary osteoarthritis 02/02/2019  ? Low back pain 02/02/2019  ? Neck pain 02/02/2019  ? Osteoarthritis of knee 02/02/2019  ? ?Past Medical History:  ?Diagnosis Date  ? Back pain   ? lower back  ? COPD (chronic obstructive pulmonary disease) (HCC)   ? Dependent on wheelchair   ? Depression   ? 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 type 2  ? HLD (hyperlipidemia)   ? Hypertension   ? Insomnia   ? Osteoarthritis of left hip   ? Seasonal allergies   ? Sleep apnea   ? uses CPAP nightly  ? Spondylolysis of cervical region   ? Spondylolysis of lumbar region   ?  ?No family history on file.  ?Past Surgical History:  ?Procedure Laterality Date  ? COLONOSCOPY    ? several x 3  ? JOINT REPLACEMENT  2015  ? left  knee  ? PALATE SURGERY  2008  ? benign  ? 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  ? Procedure: LEFT TOTAL HIP ARTHROPLASTY ANTERIOR APPROACH;  Surgeon: Tarry Kos, MD;  Location: [facility] OR;  Service: Orthopedics;  Laterality: Left;  3-C  ? UPPER GI ENDOSCOPY    ? several - stomach ulcers  ? ?Social History  ? ?Occupational History  ? Not on file  ?Tobacco Use  ? Smoking status: Former  ?  Packs/day: 1.00  ?  Years: 20.00  ?  Pack years: 20.00  ?  Types: Cigarettes  ?  Quit date: 08/24/1991  ?  Years since quitting: 30.4  ? Smokeless tobacco: Never  ?Vaping Use  ? Vaping Use: Never used  ?Substance and Sexual Activity  ? Alcohol use: Not Currently  ?  Comment: wine  ?  Drug use: Never  ? Sexual activity: Not Currently  ? ? ? ?

## 2022-02-07 ENCOUNTER — Other Ambulatory Visit: Payer: Self-pay | Admitting: Physician Assistant

## 2022-02-22 ENCOUNTER — Ambulatory Visit (HOSPITAL_COMMUNITY): Payer: BC Managed Care – PPO | Admitting: Physical Therapy

## 2022-03-06 ENCOUNTER — Other Ambulatory Visit: Payer: Self-pay

## 2022-03-06 ENCOUNTER — Encounter: Payer: Self-pay | Admitting: Orthopaedic Surgery

## 2022-03-06 ENCOUNTER — Ambulatory Visit (INDEPENDENT_AMBULATORY_CARE_PROVIDER_SITE_OTHER): Payer: BC Managed Care – PPO

## 2022-03-06 ENCOUNTER — Ambulatory Visit: Payer: BC Managed Care – PPO | Admitting: Orthopaedic Surgery

## 2022-03-06 DIAGNOSIS — M1611 Unilateral primary osteoarthritis, right hip: Secondary | ICD-10-CM | POA: Insufficient documentation

## 2022-03-06 DIAGNOSIS — Z96642 Presence of left artificial hip joint: Secondary | ICD-10-CM

## 2022-03-06 NOTE — Progress Notes (Signed)
? ?Post-Op Visit Note ?  ?Patient: Kent Rodriguez           ?Date of Birth: 09-06-1946           ?MRN: [id number] ?Visit Date: 03/06/2022 ?PCP: Leone Haven, MD ? ? ?Assessment & Plan: ? ?Chief Complaint:  ?Chief Complaint  ?Patient presents with  ? Left Hip - Follow-up  ?  Left total hip arthroplasty 01/22/22  ? ?Visit Diagnoses:  ?1. Status post total replacement of left hip   ?2. Primary osteoarthritis of right hip   ? ? ?Plan: Patient returns today 6 weeks status post left total hip replacement.  Also following for end-stage right hip DJD.  He is eager to have the right hip replaced soon as possible.  He is feeling much better in terms of the left hip.  He has been walking with a rolling walker at home and doing in bed and sitting exercises.  The right hip is giving way quite a bit and he is fearful of falling. ? ?Examination left hip shows a fully healed surgical scar.  He has painless range of motion of the left hip.   ?Examination of right hip shows severe pain with attempted range of motion. ? ?Patient is doing well from his left hip replacement.  We will see him back in another 6 weeks for recheck.  Dental prophylaxis reinforced.  He would like to go ahead and get on the schedule for right total hip replacement in about 6 to 8 weeks. ? ?Follow-Up Instructions: Return in about 6 weeks (around 04/17/2022).  ? ?Orders:  ?Orders Placed This Encounter  ?Procedures  ? XR Pelvis 1-2 Views  ? ?No orders of the defined types were placed in this encounter. ? ? ?Imaging: ?XR Pelvis 1-2 Views ? ?Result Date: 03/06/2022 ?Stable total hip replacement without complications  ? ?PMFS History: ?Patient Active Problem List  ? Diagnosis Date Noted  ? Primary osteoarthritis of right hip 03/06/2022  ? Primary osteoarthritis of left hip 01/22/2022  ? Status post total replacement of left hip 01/22/2022  ? High blood cholesterol 11/06/2021  ? Insomnia 11/06/2021  ? Diabetes mellitus without complication ([place]) [id number]  ?  Essential hypertension 07/16/2019  ? Primary osteoarthritis involving multiple joints 07/16/2019  ? Screening for prostate cancer 07/16/2019  ? Obstructive sleep apnea (adult) (pediatric) 02/26/2019  ? Morbid (severe) obesity due to excess calories ([place]) 02/26/2019  ? Cervical spondylosis 02/02/2019  ? Lumbar spondylosis 02/02/2019  ? Depression, unspecified 02/02/2019  ? Dyspnea on exertion 02/02/2019  ? Hand pain 02/02/2019  ? Chest pain with high risk for cardiac etiology 02/02/2019  ? Hip pain 02/02/2019  ? Knee pain 02/02/2019  ? Localized, primary osteoarthritis 02/02/2019  ? Low back pain 02/02/2019  ? Neck pain 02/02/2019  ? Osteoarthritis of knee 02/02/2019  ? ?Past Medical History:  ?Diagnosis Date  ? Back pain   ? lower back  ? COPD (chronic obstructive pulmonary disease) ([place])   ? Dependent on wheelchair   ? Depression   ? Diabetes mellitus without complication ([place])   ? type 2  ? HLD (hyperlipidemia)   ? Hypertension   ? Insomnia   ? Osteoarthritis of left hip   ? Seasonal allergies   ? Sleep apnea   ? uses CPAP nightly  ? Spondylolysis of cervical region   ? Spondylolysis of lumbar region   ?  ?No family history on file.  ?Past Surgical History:  ?Procedure Laterality Date  ? COLONOSCOPY    ?  several x 3  ? JOINT REPLACEMENT  2015  ? left knee  ? PALATE SURGERY  2008  ? benign  ? 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  ? Procedure: LEFT TOTAL HIP ARTHROPLASTY ANTERIOR APPROACH;  Surgeon: Leandrew Koyanagi, MD;  Location: [facility];  Service: Orthopedics;  Laterality: Left;  3-C  ? UPPER GI ENDOSCOPY    ? several - stomach ulcers  ? ?Social History  ? ?Occupational History  ? Not on file  ?Tobacco Use  ? Smoking status: Former  ?  Packs/day: 1.00  ?  Years: 20.00  ?  Pack years: 20.00  ?  Types: Cigarettes  ?  Quit date: 08/24/1991  ?  Years since quitting: 30.5  ? Smokeless tobacco: Never  ?Vaping Use  ? Vaping Use: Never used  ?Substance and Sexual Activity  ? Alcohol use: Not Currently  ?  Comment: wine  ?  Drug use: Never  ? Sexual activity: Not Currently  ? ? ? ?

## 2022-03-15 ENCOUNTER — Encounter: Payer: Self-pay | Admitting: Physical Medicine & Rehabilitation

## 2022-04-03 ENCOUNTER — Other Ambulatory Visit: Payer: Self-pay

## 2022-04-17 ENCOUNTER — Encounter: Payer: Self-pay | Admitting: Orthopaedic Surgery

## 2022-04-17 ENCOUNTER — Ambulatory Visit (INDEPENDENT_AMBULATORY_CARE_PROVIDER_SITE_OTHER): Payer: BC Managed Care – PPO | Admitting: Orthopaedic Surgery

## 2022-04-17 DIAGNOSIS — Z96642 Presence of left artificial hip joint: Secondary | ICD-10-CM

## 2022-04-17 MED ORDER — AMOXICILLIN 500 MG PO CAPS
2000.0000 mg | ORAL_CAPSULE | Freq: Once | ORAL | 6 refills | Status: AC
Start: 2022-04-17 — End: 2022-04-17

## 2022-04-17 NOTE — Telephone Encounter (Signed)
I called pharmacy, phone rang and was on hold x 5 minutes. Will try again. Per chart, medication received by pharmacy. ?

## 2022-04-17 NOTE — Progress Notes (Signed)
? ?Post-Op Visit Note ?  ?Patient: Kent Rodriguez           ?Date of Birth: Jul 18, 1946           ?MRN: [id number] ?Visit Date: 04/17/2022 ?PCP: Gaspar Skeeters, MD ? ? ?Assessment & Plan: ? ?Chief Complaint:  ?Chief Complaint  ?Patient presents with  ? Left Hip - Routine Post Op  ? ?Visit Diagnoses:  ?1. Status post total replacement of left hip   ? ? ?Plan: Patient is 3 months status post left total hip replacement.  He is doing great from that.  His right hip replacement is scheduled for June 1. ? ?Examination left hip shows a fully healed surgical scar.  Good painless range of motion. ? ?At this point he can get into a swimming pool and do his aquatic exercises.  We will see him on June 1 for his right hip replacement. ? ?Follow-Up Instructions: No follow-ups on file.  ? ?Orders:  ?No orders of the defined types were placed in this encounter. ? ?Meds ordered this encounter  ?Medications  ? amoxicillin (AMOXIL) 500 MG capsule  ?  Sig: Take 4 capsules (2,000 mg total) by mouth once for 1 dose.  ?  Dispense:  4 capsule  ?  Refill:  6  ? ? ?Imaging: ?No results found. ? ?PMFS History: ?Patient Active Problem List  ? Diagnosis Date Noted  ? Primary osteoarthritis of right hip 03/06/2022  ? Primary osteoarthritis of left hip 01/22/2022  ? Status post total replacement of left hip 01/22/2022  ? High blood cholesterol 11/06/2021  ? Insomnia 11/06/2021  ? Diabetes mellitus without complication (HCC) 07/16/2019  ? Essential hypertension 07/16/2019  ? Primary osteoarthritis involving multiple joints 07/16/2019  ? Screening for prostate cancer 07/16/2019  ? Obstructive sleep apnea (adult) (pediatric) 02/26/2019  ? Morbid (severe) obesity due to excess calories (HCC) 02/26/2019  ? Cervical spondylosis 02/02/2019  ? Lumbar spondylosis 02/02/2019  ? Depression, unspecified 02/02/2019  ? Dyspnea on exertion 02/02/2019  ? Hand pain 02/02/2019  ? Chest pain with high risk for cardiac etiology 02/02/2019  ? Hip pain  02/02/2019  ? Knee pain 02/02/2019  ? Localized, primary osteoarthritis 02/02/2019  ? Low back pain 02/02/2019  ? Neck pain 02/02/2019  ? Osteoarthritis of knee 02/02/2019  ? ?Past Medical History:  ?Diagnosis Date  ? Back pain   ? lower back  ? COPD (chronic obstructive pulmonary disease) (HCC)   ? Dependent on wheelchair   ? Depression   ? 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 type 2  ? HLD (hyperlipidemia)   ? Hypertension   ? Insomnia   ? Osteoarthritis of left hip   ? Seasonal allergies   ? Sleep apnea   ? uses CPAP nightly  ? Spondylolysis of cervical region   ? Spondylolysis of lumbar region   ?  ?No family history on file.  ?Past Surgical History:  ?Procedure Laterality Date  ? COLONOSCOPY    ? several x 3  ? JOINT REPLACEMENT  2015  ? left knee  ? PALATE SURGERY  2008  ? benign  ? 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  ? Procedure: LEFT TOTAL HIP ARTHROPLASTY ANTERIOR APPROACH;  Surgeon: Tarry Kos, MD;  Location: [facility] OR;  Service: Orthopedics;  Laterality: Left;  3-C  ? UPPER GI ENDOSCOPY    ? several - stomach ulcers  ? ?Social History  ? ?Occupational History  ? Not on file  ?Tobacco Use  ?  Smoking status: Former  ?  Packs/day: 1.00  ?  Years: 20.00  ?  Pack years: 20.00  ?  Types: Cigarettes  ?  Quit date: 08/24/1991  ?  Years since quitting: 30.6  ? Smokeless tobacco: Never  ?Vaping Use  ? Vaping Use: Never used  ?Substance and Sexual Activity  ? Alcohol use: Not Currently  ?  Comment: wine  ? Drug use: Never  ? Sexual activity: Not Currently  ? ? ? ?

## 2022-04-24 NOTE — Progress Notes (Signed)
Surgical Instructions    Your procedure is scheduled on Thursday June 1.  Report to [facility] Main Entrance "A" at 9:45 A.M., then check in with the Admitting office.  Call this number if you have problems the morning of surgery:  [phone]   If you have any questions prior to your surgery date call [phone]: Open Monday-Friday 8am-4pm    Remember:  Do not eat after midnight the night before your surgery  You may drink clear liquids until 8:45am the morning of your surgery.   Clear liquids allowed are: Water, Non-Citrus Juices (without pulp), Carbonated Beverages, Clear Tea, Black Coffee ONLY (NO MILK, CREAM OR POWDERED CREAMER of any kind), and Gatorade   The day of surgery (if you have diabetes): Drink ONE (1) 12 oz G2 given to you in your pre admission testing appointment by 8:45am the morning of surgery. Drink in one sitting. Do not sip.  This drink was given to you during your hospital  pre-op appointment visit.  Nothing else to drink after completing the  12 oz bottle of G2.        If you have questions, please contact your surgeon's office.     Take these medicines the morning of surgery with A SIP OF WATER:  metoprolol succinate (TOPROL-XL) fluticasone (FLONASE) 50 MCG/ACT nasal spray if needed HYDROcodone-acetaminophen (NORCO) if needed morphine (MS CONTIN) if needed naloxone [facility]) nasal spray if needed ondansetron [facility]) if needed  As of today, STOP taking any Aspirin (unless otherwise instructed by your surgeon) Aleve, Naproxen, Ibuprofen, Motrin, Advil, Goody's, BC's, all herbal medications, fish oil, and all vitamins.  WHAT DO I DO ABOUT MY DIABETES MEDICATION?  Do not take oral diabetes medicines (pills) the morning of surgery. DO NOT TAKE metFORMIN (GLUCOPHAGE-XR) THE MORNING OF SURGERY.   HOW TO MANAGE YOUR DIABETES BEFORE AND AFTER SURGERY  Why is it important to control my blood sugar before and after surgery? Improving blood sugar levels  before and after surgery helps healing and can limit problems. A way of improving blood sugar control is eating a healthy diet by:  Eating less sugar and carbohydrates  Increasing activity/exercise  Talking with your doctor about reaching your blood sugar goals High blood sugars (greater than 180 mg/dL) can raise your risk of infections and slow your recovery, so you will need to focus on controlling your diabetes during the weeks before surgery. Make sure that the doctor who takes care of your diabetes knows about your planned surgery including the date and location.  How do I manage my blood sugar before surgery? Check your blood sugar at least 4 times a day, starting 2 days before surgery, to make sure that the level is not too high or low.  Check your blood sugar the morning of your surgery when you wake up and every 2 hours until you get to the Short Stay unit.  If your blood sugar is less than 70 mg/dL, you will need to treat for low blood sugar: Do not take insulin. Treat a low blood sugar (less than 70 mg/dL) with  cup of clear juice (cranberry or apple), 4 glucose tablets, OR glucose gel. Recheck blood sugar in 15 minutes after treatment (to make sure it is greater than 70 mg/dL). If your blood sugar is not greater than 70 mg/dL on recheck, call [phone] for further instructions. Report your blood sugar to the short stay nurse when you get to Short Stay.  If you are admitted to the hospital  after surgery: Your blood sugar will be checked by the staff and you will probably be given insulin after surgery (instead of oral diabetes medicines) to make sure you have good blood sugar levels. The goal for blood sugar control after surgery is 80-180 mg/dL.          Do not wear jewelry or makeup Do not wear lotions, powders, perfumes/colognes, or deodorant. Do not shave 48 hours prior to surgery.  Men may shave face and neck. Do not bring valuables to the hospital. Do not wear nail  polish, gel polish, artificial nails, or any other type of covering on natural nails (fingers and toes) If you have artificial nails or gel coating that need to be removed by a nail salon, please have this removed prior to surgery. Artificial nails or gel coating may interfere with anesthesia's ability to adequately monitor your vital signs.  [facility] is not responsible for any belongings or valuables. .   Do NOT Smoke (Tobacco/Vaping)  24 hours prior to your procedure  If you use a CPAP at night, you may bring your mask for your overnight stay.   Contacts, glasses, hearing aids, dentures or partials may not be worn into surgery, please bring cases for these belongings   For patients admitted to the hospital, discharge time will be determined by your treatment team.   Patients discharged the day of surgery will not be allowed to drive home, and someone needs to stay with them for 24 hours.   SURGICAL WAITING ROOM VISITATION Patients having surgery or a procedure in a hospital may have two support people. Children under the age of 1 must have an adult with them who is not the patient. They may stay in the waiting area during the procedure and may switch out with other visitors. If the patient needs to stay at the hospital during part of their recovery, the visitor guidelines for inpatient rooms apply.  Please refer to the [facility] website for the visitor guidelines for Inpatients (after your surgery is over and you are in a regular room).       Special instructions:    Oral Hygiene is also important to reduce your risk of infection.  Remember - BRUSH YOUR TEETH THE MORNING OF SURGERY WITH YOUR REGULAR TOOTHPASTE   Center- Preparing For Surgery  Before surgery, you can play an important role. Because skin is not sterile, your skin needs to be as free of germs as possible. You can reduce the number of germs on your skin by washing with CHG (chlorahexidine gluconate) Soap  before surgery.  CHG is an antiseptic cleaner which kills germs and bonds with the skin to continue killing germs even after washing.     Please do not use if you have an allergy to CHG or antibacterial soaps. If your skin becomes reddened/irritated stop using the CHG.  Do not shave (including legs and underarms) for at least 48 hours prior to first CHG shower. It is OK to shave your face.  Please follow these instructions carefully.     Shower the NIGHT BEFORE SURGERY and the MORNING OF SURGERY with CHG Soap.   If you chose to wash your hair, wash your hair first as usual with your normal shampoo. After you shampoo, rinse your hair and body thoroughly to remove the shampoo.  Then [employer] and genitals (private parts) with your normal soap and rinse thoroughly to remove soap.  After that Use CHG Soap as you would  any other liquid soap. You can apply CHG directly to the skin and wash gently with a scrungie or a clean washcloth.   Apply the CHG Soap to your body ONLY FROM THE NECK DOWN.  Do not use on open wounds or open sores. Avoid contact with your eyes, ears, mouth and genitals (private parts). Wash Face and genitals (private parts)  with your normal soap.   Wash thoroughly, paying special attention to the area where your surgery will be performed.  Thoroughly rinse your body with warm water from the neck down.  DO NOT shower/wash with your normal soap after using and rinsing off the CHG Soap.  Pat yourself dry with a CLEAN TOWEL.  Wear CLEAN PAJAMAS to bed the night before surgery  Place CLEAN SHEETS on your bed the night before your surgery  DO NOT SLEEP WITH PETS.   Day of Surgery:  Take a shower with CHG soap. Wear Clean/Comfortable clothing the morning of surgery Do not apply any deodorants/lotions.   Remember to brush your teeth WITH YOUR REGULAR TOOTHPASTE.    If you received a COVID test during your pre-op visit, it is requested that you wear a mask when out in  public, stay away from anyone that may not be feeling well, and notify your surgeon if you develop symptoms. If you have been in contact with anyone that has tested positive in the last 10 days, please notify your surgeon.    Please read over the following fact sheets that you were given.

## 2022-04-25 ENCOUNTER — Encounter (HOSPITAL_COMMUNITY): Payer: Self-pay

## 2022-04-25 ENCOUNTER — Encounter (HOSPITAL_COMMUNITY)
Admission: RE | Admit: 2022-04-25 | Discharge: 2022-04-25 | Disposition: A | Discharge status: Home / Self Care | Payer: BC Managed Care – PPO | Source: Ambulatory Visit | Attending: Orthopaedic Surgery | Admitting: Orthopaedic Surgery

## 2022-04-25 ENCOUNTER — Other Ambulatory Visit: Payer: Self-pay

## 2022-04-25 VITALS — BP 127/74 | HR 60 | Temp 98.2°F | Resp 17 | Ht 66.0 in | Wt 244.0 lb

## 2022-04-25 DIAGNOSIS — Z01812 Encounter for preprocedural laboratory examination: Secondary | ICD-10-CM | POA: Insufficient documentation

## 2022-04-25 DIAGNOSIS — M1611 Unilateral primary osteoarthritis, right hip: Secondary | ICD-10-CM

## 2022-04-25 DIAGNOSIS — Z01818 Encounter for other preprocedural examination: Secondary | ICD-10-CM

## 2022-04-25 LAB — COMPREHENSIVE METABOLIC PANEL
ALT: 19 U/L (ref 0–44)
AST: 20 U/L (ref 15–41)
Albumin: 3.9 g/dL (ref 3.5–5.0)
Alkaline Phosphatase: 44 U/L (ref 38–126)
Anion gap: 8 (ref 5–15)
BUN: 27 mg/dL — ABNORMAL HIGH (ref 8–23)
CO2: 26 mmol/L (ref 22–32)
Calcium: 9.3 mg/dL (ref 8.9–10.3)
Chloride: 104 mmol/L (ref 98–111)
Creatinine, Ser: 1.06 mg/dL (ref 0.61–1.24)
GFR, Estimated: >60 mL/min (ref >60)
Glucose, Bld: 109 mg/dL — ABNORMAL HIGH (ref 70–99)
Potassium: 4.2 mmol/L (ref 3.5–5.1)
Sodium: 138 mmol/L (ref 135–145)
Total Bilirubin: 0.6 mg/dL (ref 0.3–1.2)
Total Protein: 6.8 g/dL (ref 6.5–8.1)

## 2022-04-25 LAB — TYPE AND SCREEN
ABO/RH(D): AB POS
Antibody Screen: NEGATIVE

## 2022-04-25 LAB — GLUCOSE, CAPILLARY: Glucose-Capillary: 110 mg/dL — ABNORMAL HIGH (ref 70–99)

## 2022-04-25 LAB — CBC
HCT: 45.8 % (ref 39.0–52.0)
Hemoglobin: 15.1 g/dL (ref 13.0–17.0)
MCH: 30.6 pg (ref 26.0–34.0)
MCHC: 33 g/dL (ref 30.0–36.0)
MCV: 92.7 fL (ref 80.0–100.0)
Platelets: 282 10*3/uL (ref 150–400)
RBC: 4.94 MIL/uL (ref 4.22–5.81)
RDW: 13.8 % (ref 11.5–15.5)
WBC: 7.5 10*3/uL (ref 4.0–10.5)
nRBC: 0 % (ref 0.0–0.2)

## 2022-04-25 LAB — SURGICAL PCR SCREEN
MRSA, PCR: NEGATIVE
Staphylococcus aureus: NEGATIVE

## 2022-04-25 LAB — HEMOGLOBIN A1C
Hgb A1c MFr Bld: 5.7 % — ABNORMAL HIGH (ref 4.8–5.6)
Mean Plasma Glucose: 116.89 mg/dL

## 2022-04-27 ENCOUNTER — Other Ambulatory Visit: Payer: Self-pay | Admitting: Physician Assistant

## 2022-04-27 MED ORDER — ASPIRIN 81 MG PO TBEC: 81.0000 mg | DELAYED_RELEASE_TABLET | Freq: Two times a day (BID) | ORAL | 0 refills | Status: DC

## 2022-04-27 MED ORDER — METHOCARBAMOL 750 MG PO TABS: 750.0000 mg | ORAL_TABLET | Freq: Two times a day (BID) | ORAL | 2 refills | Status: DC | PRN

## 2022-04-27 MED ORDER — DOCUSATE SODIUM 100 MG PO CAPS: 100.0000 mg | ORAL_CAPSULE | Freq: Every day | ORAL | 2 refills | Status: DC | PRN

## 2022-04-27 MED ORDER — SULFAMETHOXAZOLE-TRIMETHOPRIM 800-160 MG PO TABS: 1.0000 | ORAL_TABLET | Freq: Two times a day (BID) | ORAL | 0 refills | Status: DC

## 2022-04-27 MED ORDER — ONDANSETRON HCL 4 MG PO TABS: 4.0000 mg | ORAL_TABLET | Freq: Three times a day (TID) | ORAL | 0 refills | Status: DC | PRN

## 2022-04-27 MED ORDER — HYDROCODONE-ACETAMINOPHEN 7.5-325 MG PO TABS: 1.0000 | ORAL_TABLET | Freq: Four times a day (QID) | ORAL | 0 refills | Status: DC | PRN

## 2022-05-02 MED ORDER — TRANEXAMIC ACID 1000 MG/10ML IV SOLN
2000.0000 mg | INTRAVENOUS | Status: DC
  Filled 2022-05-02: qty 20

## 2022-05-02 NOTE — Progress Notes (Signed)
Patient voiced understanding of new arrival time of 0845 and finish ERAS by [phone].

## 2022-05-03 ENCOUNTER — Ambulatory Visit (HOSPITAL_COMMUNITY): Payer: BC Managed Care – PPO

## 2022-05-03 ENCOUNTER — Other Ambulatory Visit: Payer: Self-pay

## 2022-05-03 ENCOUNTER — Observation Stay (HOSPITAL_COMMUNITY): Payer: BC Managed Care – PPO

## 2022-05-03 ENCOUNTER — Ambulatory Visit (HOSPITAL_COMMUNITY): Payer: BC Managed Care – PPO | Admitting: Anesthesiology

## 2022-05-03 ENCOUNTER — Encounter (HOSPITAL_COMMUNITY): Payer: Self-pay | Admitting: Orthopaedic Surgery

## 2022-05-03 ENCOUNTER — Encounter (HOSPITAL_COMMUNITY)
Admission: RE | Disposition: A | Discharge status: Home Health | Payer: Self-pay | Source: Home / Self Care | Attending: Orthopaedic Surgery

## 2022-05-03 ENCOUNTER — Observation Stay (HOSPITAL_COMMUNITY)
Admission: RE | Admit: 2022-05-03 | Discharge: 2022-05-04 | Disposition: A | Discharge status: Home Health | Payer: BC Managed Care – PPO | Attending: Orthopaedic Surgery | Admitting: Orthopaedic Surgery

## 2022-05-03 DIAGNOSIS — E119 Type 2 diabetes mellitus without complications: Secondary | ICD-10-CM | POA: Insufficient documentation

## 2022-05-03 DIAGNOSIS — J449 Chronic obstructive pulmonary disease, unspecified: Secondary | ICD-10-CM | POA: Insufficient documentation

## 2022-05-03 DIAGNOSIS — Z7982 Long term (current) use of aspirin: Secondary | ICD-10-CM | POA: Insufficient documentation

## 2022-05-03 DIAGNOSIS — Z87891 Personal history of nicotine dependence: Secondary | ICD-10-CM | POA: Insufficient documentation

## 2022-05-03 DIAGNOSIS — Z96652 Presence of left artificial knee joint: Secondary | ICD-10-CM | POA: Diagnosis not present

## 2022-05-03 DIAGNOSIS — Z79899 Other long term (current) drug therapy: Secondary | ICD-10-CM | POA: Insufficient documentation

## 2022-05-03 DIAGNOSIS — Z96641 Presence of right artificial hip joint: Secondary | ICD-10-CM

## 2022-05-03 DIAGNOSIS — Z01818 Encounter for other preprocedural examination: Secondary | ICD-10-CM

## 2022-05-03 DIAGNOSIS — Z7984 Long term (current) use of oral hypoglycemic drugs: Secondary | ICD-10-CM | POA: Insufficient documentation

## 2022-05-03 DIAGNOSIS — I10 Essential (primary) hypertension: Secondary | ICD-10-CM | POA: Diagnosis not present

## 2022-05-03 DIAGNOSIS — Z96642 Presence of left artificial hip joint: Secondary | ICD-10-CM | POA: Diagnosis not present

## 2022-05-03 DIAGNOSIS — M1611 Unilateral primary osteoarthritis, right hip: Principal | ICD-10-CM | POA: Insufficient documentation

## 2022-05-03 HISTORY — PX: TOTAL HIP ARTHROPLASTY: SHX124

## 2022-05-03 LAB — GLUCOSE, CAPILLARY
Glucose-Capillary: 118 mg/dL — ABNORMAL HIGH (ref 70–99)
Glucose-Capillary: 114 mg/dL — ABNORMAL HIGH (ref 70–99)
Glucose-Capillary: 103 mg/dL — ABNORMAL HIGH (ref 70–99)
Glucose-Capillary: 155 mg/dL — ABNORMAL HIGH (ref 70–99)
Glucose-Capillary: 167 mg/dL — ABNORMAL HIGH (ref 70–99)

## 2022-05-03 SURGERY — ARTHROPLASTY, HIP, TOTAL, ANTERIOR APPROACH
Anesthesia: Spinal | Site: Hip | Laterality: Right

## 2022-05-03 MED ORDER — INSULIN ASPART 100 UNIT/ML IJ SOLN: 0.0000 [IU] | Freq: Every day | INTRAMUSCULAR | Status: DC

## 2022-05-03 MED ORDER — POVIDONE-IODINE 10 % EX SWAB: 2.0000 "application " | Freq: Once | CUTANEOUS | Status: DC

## 2022-05-03 MED ORDER — HYDROMORPHONE HCL 1 MG/ML IJ SOLN
INTRAMUSCULAR | Status: AC
  Filled 2022-05-03: qty 1

## 2022-05-03 MED ORDER — MIDAZOLAM HCL 2 MG/2ML IJ SOLN
INTRAMUSCULAR | Status: AC
  Filled 2022-05-03: qty 2

## 2022-05-03 MED ORDER — FENTANYL CITRATE (PF) 250 MCG/5ML IJ SOLN
INTRAMUSCULAR | Status: AC
  Filled 2022-05-03: qty 5

## 2022-05-03 MED ORDER — METOPROLOL SUCCINATE ER 50 MG PO TB24
50.0000 mg | ORAL_TABLET | Freq: Every day | ORAL | Status: DC
  Administered 2022-05-04: 50 mg via ORAL
  Filled 2022-05-03: qty 1

## 2022-05-03 MED ORDER — HYDROCODONE-ACETAMINOPHEN 7.5-325 MG PO TABS
1.0000 | ORAL_TABLET | Freq: Once | ORAL | Status: AC | PRN
  Administered 2022-05-03: 1 via ORAL

## 2022-05-03 MED ORDER — HYDROMORPHONE HCL 1 MG/ML IJ SOLN
0.2500 mg | INTRAMUSCULAR | Status: DC | PRN
  Administered 2022-05-03 (×4): 0.5 mg via INTRAVENOUS

## 2022-05-03 MED ORDER — TRANEXAMIC ACID-NACL 1000-0.7 MG/100ML-% IV SOLN
1000.0000 mg | Freq: Once | INTRAVENOUS | Status: AC
  Administered 2022-05-03: 1000 mg via INTRAVENOUS
  Filled 2022-05-03: qty 100

## 2022-05-03 MED ORDER — ACETAMINOPHEN 500 MG PO TABS
500.0000 mg | ORAL_TABLET | Freq: Four times a day (QID) | ORAL | Status: DC
  Administered 2022-05-03 – 2022-05-04 (×3): 500 mg via ORAL
  Filled 2022-05-03 (×3): qty 1

## 2022-05-03 MED ORDER — METFORMIN HCL ER 500 MG PO TB24
1000.0000 mg | ORAL_TABLET | Freq: Every evening | ORAL | Status: DC
  Administered 2022-05-03: 1000 mg via ORAL
  Filled 2022-05-03 (×2): qty 2

## 2022-05-03 MED ORDER — SODIUM CHLORIDE 0.9 % IV SOLN: INTRAVENOUS | Status: DC

## 2022-05-03 MED ORDER — ASPIRIN 81 MG PO CHEW
81.0000 mg | CHEWABLE_TABLET | Freq: Two times a day (BID) | ORAL | Status: DC
  Administered 2022-05-03 – 2022-05-04 (×2): 81 mg via ORAL
  Filled 2022-05-03 (×2): qty 1

## 2022-05-03 MED ORDER — MIDAZOLAM HCL 2 MG/2ML IJ SOLN
INTRAMUSCULAR | Status: DC | PRN
  Administered 2022-05-03 (×2): 1 mg via INTRAVENOUS

## 2022-05-03 MED ORDER — GLYCOPYRROLATE 0.2 MG/ML IJ SOLN
INTRAMUSCULAR | Status: DC | PRN
  Administered 2022-05-03: .2 mg via INTRAVENOUS

## 2022-05-03 MED ORDER — INSULIN ASPART 100 UNIT/ML IJ SOLN
0.0000 [IU] | Freq: Three times a day (TID) | INTRAMUSCULAR | Status: DC
  Administered 2022-05-03: 4 [IU] via SUBCUTANEOUS
  Administered 2022-05-04: 7 [IU] via SUBCUTANEOUS
  Administered 2022-05-04: 4 [IU] via SUBCUTANEOUS

## 2022-05-03 MED ORDER — ACETAMINOPHEN 325 MG PO TABS: 325.0000 mg | ORAL_TABLET | Freq: Four times a day (QID) | ORAL | Status: DC | PRN

## 2022-05-03 MED ORDER — HYDROCODONE-ACETAMINOPHEN 7.5-325 MG PO TABS: 1.0000 | ORAL_TABLET | ORAL | Status: DC | PRN

## 2022-05-03 MED ORDER — PROPOFOL 500 MG/50ML IV EMUL
INTRAVENOUS | Status: DC | PRN
  Administered 2022-05-03: 50 ug/kg/min via INTRAVENOUS

## 2022-05-03 MED ORDER — FERROUS SULFATE 325 (65 FE) MG PO TABS
325.0000 mg | ORAL_TABLET | Freq: Three times a day (TID) | ORAL | Status: DC
  Administered 2022-05-03 – 2022-05-04 (×3): 325 mg via ORAL
  Filled 2022-05-03 (×3): qty 1

## 2022-05-03 MED ORDER — SORBITOL 70 % SOLN: 30.0000 mL | Freq: Every day | Status: DC | PRN

## 2022-05-03 MED ORDER — FENTANYL CITRATE (PF) 250 MCG/5ML IJ SOLN
INTRAMUSCULAR | Status: DC | PRN
  Administered 2022-05-03: 50 ug via INTRAVENOUS
  Administered 2022-05-03: 25 ug via INTRAVENOUS
  Administered 2022-05-03 (×2): 50 ug via INTRAVENOUS
  Administered 2022-05-03: 25 ug via INTRAVENOUS
  Administered 2022-05-03: 50 ug via INTRAVENOUS

## 2022-05-03 MED ORDER — PROPOFOL 10 MG/ML IV BOLUS
INTRAVENOUS | Status: AC
  Filled 2022-05-03: qty 20

## 2022-05-03 MED ORDER — PHENYLEPHRINE 80 MCG/ML (10ML) SYRINGE FOR IV PUSH (FOR BLOOD PRESSURE SUPPORT)
PREFILLED_SYRINGE | INTRAVENOUS | Status: DC | PRN
  Administered 2022-05-03: 160 ug via INTRAVENOUS
  Administered 2022-05-03: 80 ug via INTRAVENOUS

## 2022-05-03 MED ORDER — LACTATED RINGERS IV SOLN: INTRAVENOUS | Status: DC

## 2022-05-03 MED ORDER — VANCOMYCIN HCL 1 G IV SOLR
INTRAVENOUS | Status: DC | PRN
  Administered 2022-05-03: 1000 mg

## 2022-05-03 MED ORDER — VANCOMYCIN HCL 1000 MG IV SOLR
INTRAVENOUS | Status: AC
  Filled 2022-05-03: qty 20

## 2022-05-03 MED ORDER — INSULIN ASPART 100 UNIT/ML IJ SOLN: 0.0000 [IU] | INTRAMUSCULAR | Status: DC | PRN

## 2022-05-03 MED ORDER — ONDANSETRON HCL 4 MG/2ML IJ SOLN: 4.0000 mg | Freq: Once | INTRAMUSCULAR | Status: DC | PRN

## 2022-05-03 MED ORDER — ONDANSETRON HCL 4 MG PO TABS: 4.0000 mg | ORAL_TABLET | Freq: Four times a day (QID) | ORAL | Status: DC | PRN

## 2022-05-03 MED ORDER — MENTHOL 3 MG MT LOZG: 1.0000 | LOZENGE | OROMUCOSAL | Status: DC | PRN

## 2022-05-03 MED ORDER — METOCLOPRAMIDE HCL 5 MG PO TABS: 5.0000 mg | ORAL_TABLET | Freq: Three times a day (TID) | ORAL | Status: DC | PRN

## 2022-05-03 MED ORDER — KETOROLAC TROMETHAMINE 15 MG/ML IJ SOLN
15.0000 mg | Freq: Four times a day (QID) | INTRAMUSCULAR | Status: AC
  Administered 2022-05-03 – 2022-05-04 (×4): 15 mg via INTRAVENOUS
  Filled 2022-05-03 (×4): qty 1

## 2022-05-03 MED ORDER — METOCLOPRAMIDE HCL 5 MG/ML IJ SOLN
5.0000 mg | Freq: Three times a day (TID) | INTRAMUSCULAR | Status: DC | PRN
  Administered 2022-05-04: 10 mg via INTRAVENOUS
  Filled 2022-05-03 (×2): qty 2

## 2022-05-03 MED ORDER — BUPIVACAINE-MELOXICAM ER 400-12 MG/14ML IJ SOLN
INTRAMUSCULAR | Status: AC
  Filled 2022-05-03: qty 1

## 2022-05-03 MED ORDER — ALUM & MAG HYDROXIDE-SIMETH 200-200-20 MG/5ML PO SUSP: 30.0000 mL | ORAL | Status: DC | PRN

## 2022-05-03 MED ORDER — ZOLPIDEM TARTRATE 5 MG PO TABS
5.0000 mg | ORAL_TABLET | Freq: Every day | ORAL | Status: DC
Start: 2022-05-03 — End: 2022-05-04
  Administered 2022-05-03: 5 mg via ORAL
  Filled 2022-05-03: qty 1

## 2022-05-03 MED ORDER — PROPOFOL 10 MG/ML IV BOLUS
INTRAVENOUS | Status: DC | PRN
  Administered 2022-05-03: 30 mg via INTRAVENOUS

## 2022-05-03 MED ORDER — PHENYLEPHRINE HCL-NACL 20-0.9 MG/250ML-% IV SOLN
INTRAVENOUS | Status: DC | PRN
  Administered 2022-05-03: 50 ug/min via INTRAVENOUS

## 2022-05-03 MED ORDER — HYDROCODONE-ACETAMINOPHEN 7.5-325 MG PO TABS
ORAL_TABLET | ORAL | Status: AC
  Filled 2022-05-03: qty 1

## 2022-05-03 MED ORDER — POLYETHYLENE GLYCOL 3350 17 G PO PACK
17.0000 g | PACK | Freq: Every day | ORAL | Status: DC
  Administered 2022-05-04: 17 g via ORAL
  Filled 2022-05-03 (×2): qty 1

## 2022-05-03 MED ORDER — CEFAZOLIN SODIUM-DEXTROSE 2-4 GM/100ML-% IV SOLN
2.0000 g | INTRAVENOUS | Status: AC
  Administered 2022-05-03: 2 g via INTRAVENOUS
  Filled 2022-05-03: qty 100

## 2022-05-03 MED ORDER — SODIUM CHLORIDE 0.9 % IR SOLN
Status: DC | PRN
  Administered 2022-05-03: 1000 mL

## 2022-05-03 MED ORDER — DIPHENHYDRAMINE HCL 12.5 MG/5ML PO ELIX: 25.0000 mg | ORAL_SOLUTION | ORAL | Status: DC | PRN

## 2022-05-03 MED ORDER — CEFAZOLIN SODIUM-DEXTROSE 2-4 GM/100ML-% IV SOLN
2.0000 g | Freq: Four times a day (QID) | INTRAVENOUS | Status: AC
  Administered 2022-05-03 (×2): 2 g via INTRAVENOUS
  Filled 2022-05-03 (×2): qty 100

## 2022-05-03 MED ORDER — ONDANSETRON HCL 4 MG/2ML IJ SOLN
4.0000 mg | Freq: Four times a day (QID) | INTRAMUSCULAR | Status: DC | PRN
  Administered 2022-05-03 – 2022-05-04 (×2): 4 mg via INTRAVENOUS
  Filled 2022-05-03 (×2): qty 2

## 2022-05-03 MED ORDER — BUPIVACAINE-MELOXICAM ER 400-12 MG/14ML IJ SOLN
INTRAMUSCULAR | Status: DC | PRN
  Administered 2022-05-03: 400 mg

## 2022-05-03 MED ORDER — DOCUSATE SODIUM 100 MG PO CAPS
100.0000 mg | ORAL_CAPSULE | Freq: Two times a day (BID) | ORAL | Status: DC
  Administered 2022-05-03 – 2022-05-04 (×2): 100 mg via ORAL
  Filled 2022-05-03 (×2): qty 1

## 2022-05-03 MED ORDER — LIDOCAINE 2% (20 MG/ML) 5 ML SYRINGE
INTRAMUSCULAR | Status: DC | PRN
  Administered 2022-05-03: 20 mg via INTRAVENOUS

## 2022-05-03 MED ORDER — HYDROCHLOROTHIAZIDE 12.5 MG PO TABS
12.5000 mg | ORAL_TABLET | Freq: Every day | ORAL | Status: DC
  Administered 2022-05-03 – 2022-05-04 (×2): 12.5 mg via ORAL
  Filled 2022-05-03 (×2): qty 1

## 2022-05-03 MED ORDER — DEXAMETHASONE SODIUM PHOSPHATE 10 MG/ML IJ SOLN
10.0000 mg | Freq: Once | INTRAMUSCULAR | Status: AC
  Administered 2022-05-04: 10 mg via INTRAVENOUS
  Filled 2022-05-03: qty 1

## 2022-05-03 MED ORDER — METHOCARBAMOL 500 MG PO TABS
500.0000 mg | ORAL_TABLET | Freq: Four times a day (QID) | ORAL | Status: DC | PRN
  Administered 2022-05-03: 500 mg via ORAL
  Filled 2022-05-03: qty 1

## 2022-05-03 MED ORDER — MORPHINE SULFATE (PF) 2 MG/ML IV SOLN
0.5000 mg | INTRAVENOUS | Status: DC | PRN
  Administered 2022-05-04: 1 mg via INTRAVENOUS
  Filled 2022-05-03: qty 1

## 2022-05-03 MED ORDER — ORAL CARE MOUTH RINSE: 15.0000 mL | Freq: Once | OROMUCOSAL | Status: AC

## 2022-05-03 MED ORDER — HYDROCODONE-ACETAMINOPHEN 5-325 MG PO TABS
1.0000 | ORAL_TABLET | ORAL | Status: DC | PRN
  Administered 2022-05-03: 1 via ORAL
  Filled 2022-05-03: qty 1

## 2022-05-03 MED ORDER — PHENOL 1.4 % MT LIQD: 1.0000 | OROMUCOSAL | Status: DC | PRN

## 2022-05-03 MED ORDER — PRONTOSAN WOUND IRRIGATION OPTIME
TOPICAL | Status: DC | PRN
  Administered 2022-05-03: 1

## 2022-05-03 MED ORDER — TRANEXAMIC ACID-NACL 1000-0.7 MG/100ML-% IV SOLN
1000.0000 mg | INTRAVENOUS | Status: AC
  Administered 2022-05-03: 1000 mg via INTRAVENOUS
  Filled 2022-05-03: qty 100

## 2022-05-03 MED ORDER — METHOCARBAMOL 1000 MG/10ML IJ SOLN: 500.0000 mg | Freq: Four times a day (QID) | INTRAVENOUS | Status: DC | PRN

## 2022-05-03 MED ORDER — MORPHINE SULFATE ER 15 MG PO TBCR: 30.0000 mg | EXTENDED_RELEASE_TABLET | Freq: Two times a day (BID) | ORAL | Status: DC | PRN

## 2022-05-03 MED ORDER — PANTOPRAZOLE SODIUM 40 MG PO TBEC
40.0000 mg | DELAYED_RELEASE_TABLET | Freq: Every day | ORAL | Status: DC
  Administered 2022-05-03 – 2022-05-04 (×2): 40 mg via ORAL
  Filled 2022-05-03 (×2): qty 1

## 2022-05-03 MED ORDER — 0.9 % SODIUM CHLORIDE (POUR BTL) OPTIME
TOPICAL | Status: DC | PRN
  Administered 2022-05-03: 1000 mL

## 2022-05-03 MED ORDER — EPHEDRINE SULFATE-NACL 50-0.9 MG/10ML-% IV SOSY
PREFILLED_SYRINGE | INTRAVENOUS | Status: DC | PRN
  Administered 2022-05-03 (×5): 5 mg via INTRAVENOUS

## 2022-05-03 MED ORDER — LISINOPRIL 10 MG PO TABS
10.0000 mg | ORAL_TABLET | Freq: Every day | ORAL | Status: DC
  Administered 2022-05-03 – 2022-05-04 (×2): 10 mg via ORAL
  Filled 2022-05-03 (×2): qty 1

## 2022-05-03 MED ORDER — TRANEXAMIC ACID 1000 MG/10ML IV SOLN
INTRAVENOUS | Status: DC | PRN
  Administered 2022-05-03: 2000 mg via TOPICAL

## 2022-05-03 MED ORDER — TRAZODONE HCL 50 MG PO TABS: 50.0000 mg | ORAL_TABLET | Freq: Every evening | ORAL | Status: DC | PRN

## 2022-05-03 MED ORDER — CHLORHEXIDINE GLUCONATE 0.12 % MT SOLN
15.0000 mL | Freq: Once | OROMUCOSAL | Status: AC
  Administered 2022-05-03: 15 mL via OROMUCOSAL
  Filled 2022-05-03: qty 15

## 2022-05-03 MED ORDER — BUPIVACAINE IN DEXTROSE 0.75-8.25 % IT SOLN
INTRATHECAL | Status: DC | PRN
  Administered 2022-05-03: 1.6 mL via INTRATHECAL

## 2022-05-03 MED ORDER — LISINOPRIL-HYDROCHLOROTHIAZIDE 10-12.5 MG PO TABS: 1.0000 | ORAL_TABLET | Freq: Every day | ORAL | Status: DC

## 2022-05-03 SURGICAL SUPPLY — 69 items
ACETAB CUP W/GRIPTION 54 (Plate) ×2 IMPLANT
BAG COUNTER SPONGE SURGICOUNT (BAG) ×2 IMPLANT
BAG DECANTER FOR FLEXI CONT (MISCELLANEOUS) ×2 IMPLANT
CANISTER WOUNDNEG PRESSURE 500 (CANNISTER) ×1 IMPLANT
CELLS DAT CNTRL 66122 CELL SVR (MISCELLANEOUS) IMPLANT
COOLER ICEMAN CLASSIC (MISCELLANEOUS) ×1 IMPLANT
COVER PERINEAL POST (MISCELLANEOUS) ×2 IMPLANT
COVER SURGICAL LIGHT HANDLE (MISCELLANEOUS) ×2 IMPLANT
CUP ACETAB W/GRIPTION 54 (Plate) IMPLANT
DRAPE C-ARM 42X72 X-RAY (DRAPES) ×2 IMPLANT
DRAPE POUCH INSTRU U-SHP 10X18 (DRAPES) ×2 IMPLANT
DRAPE STERI IOBAN 125X83 (DRAPES) ×2 IMPLANT
DRAPE U-SHAPE 47X51 STRL (DRAPES) ×4 IMPLANT
DRESSING PEEL AND PLAC PRVNA20 (GAUZE/BANDAGES/DRESSINGS) IMPLANT
DRSG PEEL AND PLACE PREVENA 20 (GAUZE/BANDAGES/DRESSINGS) ×2
DURAPREP 26ML APPLICATOR (WOUND CARE) ×4 IMPLANT
ELECT BLADE 4.0 EZ CLEAN MEGAD (MISCELLANEOUS) ×2
ELECT REM PT RETURN 9FT ADLT (ELECTROSURGICAL) ×2
ELECTRODE BLDE 4.0 EZ CLN MEGD (MISCELLANEOUS) ×1 IMPLANT
ELECTRODE REM PT RTRN 9FT ADLT (ELECTROSURGICAL) ×1 IMPLANT
GLOVE BIOGEL PI IND STRL 7.0 (GLOVE) ×1 IMPLANT
GLOVE BIOGEL PI IND STRL 7.5 (GLOVE) ×4 IMPLANT
GLOVE BIOGEL PI INDICATOR 7.0 (GLOVE) ×1
GLOVE BIOGEL PI INDICATOR 7.5 (GLOVE) ×4
GLOVE ECLIPSE 7.0 STRL STRAW (GLOVE) ×4 IMPLANT
GLOVE SKINSENSE NS SZ7.5 (GLOVE) ×1
GLOVE SKINSENSE STRL SZ7.5 (GLOVE) ×1 IMPLANT
GLOVE SURG UNDER POLY LF SZ7 (GLOVE) ×2 IMPLANT
GLOVE SURG UNDER POLY LF SZ7.5 (GLOVE) ×4 IMPLANT
GOWN STRL REIN XL XLG (GOWN DISPOSABLE) ×2 IMPLANT
GOWN STRL REUS W/ TWL LRG LVL3 (GOWN DISPOSABLE) IMPLANT
GOWN STRL REUS W/ TWL XL LVL3 (GOWN DISPOSABLE) ×1 IMPLANT
GOWN STRL REUS W/TWL LRG LVL3 (GOWN DISPOSABLE)
GOWN STRL REUS W/TWL XL LVL3 (GOWN DISPOSABLE) ×1
HANDPIECE INTERPULSE COAX TIP (DISPOSABLE) ×1
HEAD CERAMIC 36 PLUS5 (Hips) ×1 IMPLANT
HOOD PEEL AWAY FLYTE STAYCOOL (MISCELLANEOUS) ×5 IMPLANT
IV NS IRRIG 3000ML ARTHROMATIC (IV SOLUTION) ×2 IMPLANT
JET LAVAGE IRRISEPT WOUND (IRRIGATION / IRRIGATOR) ×2
KIT BASIN OR (CUSTOM PROCEDURE TRAY) ×2 IMPLANT
KIT DRSG PREVENA PLUS 7DAY 125 (MISCELLANEOUS) ×1 IMPLANT
LAVAGE JET IRRISEPT WOUND (IRRIGATION / IRRIGATOR) ×1 IMPLANT
LINER NEUTRAL 54X36MM PLUS 4 (Hips) ×1 IMPLANT
MARKER SKIN DUAL TIP RULER LAB (MISCELLANEOUS) ×2 IMPLANT
NDL SPNL 18GX3.5 QUINCKE PK (NEEDLE) ×1 IMPLANT
NEEDLE SPNL 18GX3.5 QUINCKE PK (NEEDLE) ×2 IMPLANT
PACK TOTAL JOINT (CUSTOM PROCEDURE TRAY) ×2 IMPLANT
PACK UNIVERSAL I (CUSTOM PROCEDURE TRAY) ×2 IMPLANT
RETRACTOR WND ALEXIS 18 MED (MISCELLANEOUS) IMPLANT
RTRCTR WOUND ALEXIS 18CM MED (MISCELLANEOUS)
SAW OSC TIP CART 19.5X105X1.3 (SAW) ×2 IMPLANT
SCREW 6.5MMX25MM (Screw) ×1 IMPLANT
SET HNDPC FAN SPRY TIP SCT (DISPOSABLE) ×1 IMPLANT
STAPLER VISISTAT 35W (STAPLE) IMPLANT
STEM FEM ACTIS HIGH SZ2 (Stem) ×1 IMPLANT
SUT ETHIBOND 2 V 37 (SUTURE) ×2 IMPLANT
SUT ETHILON 2 0 FS 18 (SUTURE) ×2 IMPLANT
SUT VIC AB 0 CT1 27 (SUTURE) ×1
SUT VIC AB 0 CT1 27XBRD ANBCTR (SUTURE) ×1 IMPLANT
SUT VIC AB 1 CTX 36 (SUTURE) ×1
SUT VIC AB 1 CTX36XBRD ANBCTR (SUTURE) ×1 IMPLANT
SUT VIC AB 2-0 CT1 27 (SUTURE) ×3
SUT VIC AB 2-0 CT1 TAPERPNT 27 (SUTURE) ×2 IMPLANT
SYR 50ML LL SCALE MARK (SYRINGE) ×2 IMPLANT
TOWEL GREEN STERILE (TOWEL DISPOSABLE) ×2 IMPLANT
TRAY CATH 16FR W/PLASTIC CATH (SET/KITS/TRAYS/PACK) IMPLANT
TRAY FOLEY W/BAG SLVR 16FR (SET/KITS/TRAYS/PACK) ×1
TRAY FOLEY W/BAG SLVR 16FR ST (SET/KITS/TRAYS/PACK) ×1 IMPLANT
YANKAUER SUCT BULB TIP NO VENT (SUCTIONS) ×2 IMPLANT

## 2022-05-03 NOTE — Anesthesia Preprocedure Evaluation (Addendum)
Anesthesia Evaluation  Patient identified by MRN, date of birth, ID band Patient awake    Reviewed: Allergy & Precautions, NPO status , Patient's Chart, lab work & pertinent test results  Airway Mallampati: II  TM Distance: >3 FB Neck ROM: Full    Dental no notable dental hx. (+) Teeth Intact, Dental Advisory Given, Caps   Pulmonary sleep apnea and Continuous Positive Airway Pressure Ventilation , COPD, former smoker,    Pulmonary exam normal breath sounds clear to auscultation       Cardiovascular hypertension, Pt. on medications Normal cardiovascular exam Rhythm:Regular Rate:Normal  ECG: NSR, rate 60   Neuro/Psych PSYCHIATRIC DISORDERS Depression negative neurological ROS     GI/Hepatic negative GI ROS, (+)     substance abuse  ,   Endo/Other  diabetes, Well Controlled, Type 2, Oral Hypoglycemic Agents  Renal/GU negative Renal ROS     Musculoskeletal  (+) Arthritis , Osteoarthritis,  narcotic dependentWheelchair bound Right hip DJD   Abdominal (+) + obese,   Peds  Hematology negative hematology ROS (+)   Anesthesia Other Findings   Reproductive/Obstetrics                             Anesthesia Physical  Anesthesia Plan  ASA: 3  Anesthesia Plan: Spinal   Post-op Pain Management:    Induction: Intravenous  PONV Risk Score and Plan: 2 and Ondansetron, Dexamethasone, Midazolam, Propofol infusion and Treatment may vary due to age or medical condition  Airway Management Planned: Simple Face Mask and Natural Airway  Additional Equipment: None  Intra-op Plan:   Post-operative Plan:   Informed Consent: I have reviewed the patients History and Physical, chart, labs and discussed the procedure including the risks, benefits and alternatives for the proposed anesthesia with the patient or authorized representative who has indicated his/her understanding and acceptance.     Dental  advisory given  Plan Discussed with: CRNA  Anesthesia Plan Comments: (Reviewed PAT note written 01/19/2022 by Shonna Chock, PA-C. )        Anesthesia Quick Evaluation

## 2022-05-03 NOTE — H&P (Signed)
PREOPERATIVE H&P  Chief Complaint: right hip degenerative joint disease  HPI: Kent Rodriguez is a 76 y.o. male who presents for surgical treatment of right hip degenerative joint disease.  He denies any changes in medical history.  Past Medical History:  Diagnosis Date   Back pain    lower back   COPD (chronic obstructive pulmonary disease) (HCC)    Dependent on wheelchair    Depression    Diabetes mellitus without complication (HCC)    type 2   HLD (hyperlipidemia)    Hypertension    Insomnia    Osteoarthritis of left hip    Seasonal allergies    Sleep apnea    uses CPAP nightly   Spondylolysis of cervical region    Spondylolysis of lumbar region    Past Surgical History:  Procedure Laterality Date   CARDIAC CATHETERIZATION  2003   COLONOSCOPY     several x 3   JOINT REPLACEMENT  2015   left knee   PALATE SURGERY  2008   benign   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   Procedure: LEFT TOTAL HIP ARTHROPLASTY ANTERIOR APPROACH;  Surgeon: Tarry Kos, MD;  Location: [facility] OR;  Service: Orthopedics;  Laterality: Left;  3-C   UPPER GI ENDOSCOPY     several - stomach ulcers   Social History   Socioeconomic History   Marital status: Married    Spouse name: Not on file   Number of children: Not on file   Years of education: Not on file   Highest education level: Not on file  Occupational History   Not on file  Tobacco Use   Smoking status: Former    Packs/day: 1.00    Years: 20.00    Pack years: 20.00    Types: Cigarettes    Quit date: 08/24/1991    Years since quitting: 30.7   Smokeless tobacco: Never  Vaping Use   Vaping Use: Never used  Substance and Sexual Activity   Alcohol use: Not Currently    Comment: wine   Drug use: Never   Sexual activity: Not Currently  Other Topics Concern   Not on file  Social History Narrative   Not on file   Social Determinants of Health   Financial Resource Strain: Not on file  Food Insecurity: Not on file   Transportation Needs: Not on file  Physical Activity: Not on file  Stress: Not on file  Social Connections: Not on file   No family history on file. Allergies  Allergen Reactions   Oxycodone Anxiety, Palpitations and Shortness Of Breath   Gabapentin Rash   Gemfibrozil Hives, Itching and Rash   Prior to Admission medications   Medication Sig Start Date End Date Taking? Authorizing Provider  amoxicillin (AMOXIL) 500 MG tablet Take 2,000 mg by mouth See admin instructions. Take 2000 mg by mouth prior to dental appointments 02/07/22  Yes [provider]  cholecalciferol (VITAMIN D3) 25 MCG (1000 UNIT) tablet Take 1,000 Units by mouth daily.   Yes [provider]  docusate sodium (COLACE) 100 MG capsule Take 1 capsule (100 mg total) by mouth daily as needed. Patient taking differently: Take 100 mg by mouth 2 (two) times daily. 01/15/22 01/15/23 Yes Cristie Hem, PA-C  fluticasone (FLONASE) 50 MCG/ACT nasal spray Place 1 spray into both nostrils daily as needed for allergies or rhinitis.   Yes [provider]  lisinopril-hydrochlorothiazide (ZESTORETIC) 10-12.5 MG tablet Take 1 tablet by mouth daily. 11/10/21  Yes [provider]  metFORMIN (GLUCOPHAGE-XR) 500 MG 24 hr tablet Take 1,000 mg by mouth every evening. 11/10/21  Yes [provider]  metoprolol succinate (TOPROL-XL) 50 MG 24 hr tablet Take 50 mg by mouth daily. 11/01/21  Yes [provider]  morphine (MS CONTIN) 30 MG 12 hr tablet Take 30 mg by mouth every 12 (twelve) hours as needed for pain. 12/15/21  Yes [provider]  naloxone (NARCAN) nasal spray 4 mg/0.1 mL Place 1 spray into the nose as needed (opiate overdose). 09/25/21  Yes [provider]  nystatin-triamcinolone (MYCOLOG II) cream Apply 1 application topically 2 (two) times daily as needed for rash. 09/18/21  Yes [provider]  senna (SENOKOT) 8.6 MG TABS tablet Take 1 tablet by mouth in the  morning.   Yes [provider]  simvastatin (ZOCOR) 20 MG tablet Take 20 mg by mouth at bedtime. 11/10/21  Yes [provider]  traZODone (DESYREL) 50 MG tablet Take 50 mg by mouth at bedtime as needed for sleep. 11/29/21  Yes [provider]  zolpidem (AMBIEN) 10 MG tablet Take 10 mg by mouth at bedtime. 11/29/21  Yes [provider]  aspirin EC 81 MG tablet Take 1 tablet (81 mg total) by mouth in the morning and at bedtime. To be taken after surgery to prevent blood clots 04/27/22 04/27/23  Cristie Hem, PA-C  docusate sodium (COLACE) 100 MG capsule Take 1 capsule (100 mg total) by mouth daily as needed. 04/27/22 04/27/23  Cristie Hem, PA-C  HYDROcodone-acetaminophen (NORCO) 7.5-325 MG tablet Take 1-2 tablets by mouth every 6 (six) hours as needed for moderate pain. To be taken after surgery 04/27/22   Cristie Hem, PA-C  meloxicam (MOBIC) 15 MG tablet Take 15 mg by mouth daily. Patient not taking: Reported on 04/20/2022    [provider]  methocarbamol (ROBAXIN-750) 750 MG tablet Take 1 tablet (750 mg total) by mouth 2 (two) times daily as needed for muscle spasms. 04/27/22   Cristie Hem, PA-C  ondansetron (ZOFRAN) 4 MG tablet Take 1 tablet (4 mg total) by mouth every 8 (eight) hours as needed for nausea or vomiting. 04/27/22   Cristie Hem, PA-C  sulfamethoxazole-trimethoprim (BACTRIM DS) 800-160 MG tablet Take 1 tablet by mouth 2 (two) times daily. 04/27/22   Cristie Hem, PA-C     Positive ROS: All other systems have been reviewed and were otherwise negative with the exception of those mentioned in the HPI and as above.  Physical Exam: General: Alert, no acute distress Cardiovascular: No pedal edema Respiratory: No cyanosis, no use of accessory musculature GI: abdomen soft Skin: No lesions in the area of chief complaint Neurologic: Sensation intact distally Psychiatric: Patient is competent for consent with normal mood and  affect Lymphatic: no lymphedema  MUSCULOSKELETAL: exam stable  Assessment: right hip degenerative joint disease  Plan: Plan for Procedure(s): RIGHT TOTAL HIP ARTHROPLASTY ANTERIOR APPROACH  The risks benefits and alternatives were discussed with the patient including but not limited to the risks of nonoperative treatment, versus surgical intervention including infection, bleeding, nerve injury,  blood clots, cardiopulmonary complications, morbidity, mortality, among others, and they were willing to proceed.   Preoperative templating of the joint replacement has been completed, documented, and submitted to the Operating Room personnel in order to optimize intra-operative equipment management.   Glee Arvin, MD 05/03/2022 9:19 AM

## 2022-05-03 NOTE — Anesthesia Procedure Notes (Signed)
Procedure Name: MAC Date/Time: 05/03/2022 11:34 AM Performed by: Mariea Clonts, CRNA Pre-anesthesia Checklist: Patient identified, Emergency Drugs available, Suction available, Patient being monitored and Timeout performed Patient Re-evaluated:Patient Re-evaluated prior to induction Oxygen Delivery Method: Simple face mask

## 2022-05-03 NOTE — Anesthesia Procedure Notes (Signed)
Spinal  Patient location during procedure: OR Start time: 05/03/2022 11:33 AM End time: 05/03/2022 11:37 AM Reason for block: surgical anesthesia Staffing Performed: anesthesiologist  Anesthesiologist: Mal Amabile, MD Preanesthetic Checklist Completed: patient identified, IV checked, site marked, risks and benefits discussed, surgical consent, monitors and equipment checked, pre-op evaluation and timeout performed Spinal Block Patient position: sitting Prep: DuraPrep and site prepped and draped Patient monitoring: heart rate, cardiac monitor, continuous pulse ox and blood pressure Approach: midline Location: L3-4 Injection technique: single-shot Needle Needle type: Pencan  Needle gauge: 24 G Needle length: 9 cm Assessment Sensory level: T6 Events: CSF return Additional Notes Patient tolerated procedure well. Adequate sensory level.

## 2022-05-03 NOTE — Op Note (Signed)
RIGHT TOTAL HIP ARTHROPLASTY ANTERIOR APPROACH  Procedure Note Kent Rodriguez   [id number]  Pre-op Diagnosis: right hip degenerative joint disease     Post-op Diagnosis: same   Operative Procedures  1. Total hip replacement; Right hip; uncemented [id number]  2. Application of incisional VAC on right hip.  Surgeon: Gershon Mussel, M.D.  Assist: Oneal Grout, PA-C   Anesthesia: spinal  Prosthesis: Depuy Acetabulum: Pinnacle 54 mm Femur: Actis 2 HO Head: 36 size: +4.5 Liner: +4 Bearing Type: ceramic/poly  Total Hip Arthroplasty (Anterior Approach) Op Note:  After informed consent was obtained and the operative extremity marked in the holding area, the patient was brought back to the operating room and placed supine on the HANA table. Next, the operative extremity was prepped and draped in normal sterile fashion. Surgical timeout occurred verifying patient identification, surgical site, surgical procedure and administration of antibiotics.  A modified anterior Smith-Peterson approach to the hip was performed, using the interval between tensor fascia lata and sartorius.  Dissection was carried bluntly down onto the anterior hip capsule. The lateral femoral circumflex vessels were identified and coagulated. A capsulotomy was performed and the capsular flaps tagged for later repair.  The neck osteotomy was performed. The femoral head was removed which showed severe wear, the acetabular rim was cleared of soft tissue and attention was turned to reaming the acetabulum.  Sequential reaming was performed under fluoroscopic guidance. We reamed to a size 53 mm, and then impacted the acetabular shell. A 25 mm cancellous screw was placed through the shell for added fixation.  The liner was then placed after irrigation and attention turned to the femur.  After placing the femoral hook, the leg was taken to externally rotated, extended and adducted position taking care to perform soft tissue  releases to allow for adequate mobilization of the femur. Soft tissue was cleared from the shoulder of the greater trochanter and the hook elevator used to improve exposure of the proximal femur. Sequential broaching performed up to a size 2. Trial neck and head were placed. The leg was brought back up to neutral and the construct reduced.  Antibiotic irrigation was placed in the surgical wound.  The position and sizing of components, offset and leg lengths were checked using fluoroscopy. Stability of the construct was checked in extension and external rotation without any subluxation or impingement of prosthesis. We dislocated the prosthesis, dropped the leg back into position, removed trial components, and irrigated copiously. The final stem and head was then placed, the leg brought back up, the system reduced and fluoroscopy used to verify positioning.  We irrigated, obtained hemostasis and closed the capsule using #2 ethibond suture.  One gram of vancomycin powder was placed in the surgical bed.   One gram of topical tranexamic acid was injected into the joint.  The fascia was closed with #1 vicryl plus, the deep fat layer was closed with 0 vicryl, the subcutaneous layers closed with 2.0 Vicryl Plus and the skin closed with 2.0 nylon and incisional VAC. A sterile dressing was applied. The patient was awakened in the operating room and taken to recovery in stable condition.  All sponge, needle, and instrument counts were correct at the end of the case.   Tessa Lerner, my PA, was a medical necessity for opening, closing, limb positioning, retracting, exposing, and overall facilitation and timely completion of the surgery.  Position: supine  Complications: see description of procedure.  Time Out: performed   Drains/Packing: none  Estimated blood  loss: see anesthesia record  Returned to Recovery Room: in good condition.   Antibiotics: yes   Mechanical VTE (DVT) Prophylaxis: sequential  compression devices, TED thigh-high  Chemical VTE (DVT) Prophylaxis: aspirin   Fluid Replacement: see anesthesia record  Specimens Removed: 1 to pathology   Sponge and Instrument Count Correct? yes   PACU: portable radiograph - low AP   Plan/RTC: Return in 2 weeks for staple removal. Weight Bearing/Load Lower Extremity: full  Hip precautions: none Suture Removal: 2 weeks   N. Glee Arvin, MD [facility] 1:25 PM   Implant Name Type Inv. Item Serial No. Manufacturer Lot No. LRB No. Used Action  ACETAB CUP Cathe Mons - [id number] Plate ACETAB CUP W GRIPTION  [employer] [id number] Right 1 Implanted  LINER NEUTRAL 54X36MM PLUS 4 - [id number] Hips LINER NEUTRAL 54X36MM PLUS 4  [employer] [id number] Right 1 Implanted  SCREW 6.5MMX25MM - [id number] Screw SCREW 6.5MMX25MM  [employer] [id number] Right 1 Implanted  STEM FEM ACTIS HIGH SZ2 - [id number] Stem STEM FEM ACTIS HIGH SZ2  [employer] [id number] Right 1 Implanted  HEAD CERAMIC 36 PLUS5 - [id number] Hips HEAD CERAMIC 36 PLUS5  [employer] [id number] Right 1 Implanted

## 2022-05-03 NOTE — Transfer of Care (Signed)
Immediate Anesthesia Transfer of Care Note  Patient: Kent Rodriguez  Procedure(s) Performed: RIGHT TOTAL HIP ARTHROPLASTY ANTERIOR APPROACH (Right: Hip)  Patient Location: PACU  Anesthesia Type:Spinal  Level of Consciousness: awake, alert  and oriented  Airway & Oxygen Therapy: Patient Spontanous Breathing and Patient connected to nasal cannula oxygen  Post-op Assessment: Report given to RN and Post -op Vital signs reviewed and stable  Post vital signs: Reviewed and stable  Last Vitals:  Vitals Value Taken Time  BP 126/60 05/03/22 1414  Temp 36.7 C 05/03/22 1413  Pulse 71 05/03/22 1418  Resp 17 05/03/22 1418  SpO2 100 % 05/03/22 1418  Vitals shown include unvalidated device data.  Last Pain:  Vitals:   05/03/22 0924  TempSrc:   PainSc: 6          Complications: No notable events documented.

## 2022-05-03 NOTE — Discharge Instructions (Signed)

## 2022-05-03 NOTE — Anesthesia Postprocedure Evaluation (Signed)
Anesthesia Post Note  Patient: Kent Rodriguez  Procedure(s) Performed: RIGHT TOTAL HIP ARTHROPLASTY ANTERIOR APPROACH (Right: Hip)     Patient location during evaluation: PACU Anesthesia Type: Spinal Level of consciousness: awake and alert Pain management: pain level controlled Vital Signs Assessment: post-procedure vital signs reviewed and stable Respiratory status: spontaneous breathing and respiratory function stable Cardiovascular status: blood pressure returned to baseline and stable Postop Assessment: spinal receding Anesthetic complications: no   No notable events documented.  Last Vitals:  Vitals:   05/03/22 1543 05/03/22 1626  BP: (!) 156/82 (!) 140/102  Pulse: 82 86  Resp: 18 18  Temp: 36.7 C   SpO2: 100% 99%    Last Pain:  Vitals:   05/03/22 1533  TempSrc:   PainSc: 3                  Kennieth Rad

## 2022-05-03 NOTE — Evaluation (Signed)
Physical Therapy Evaluation Patient Details Name: Kent Rodriguez MRN: [id number] DOB: 1946-08-28 Today's Date: 05/03/2022  History of Present Illness  Patient is 76 y.o. male who presented 05/03/22 for R THA anterior approach secondary to R hip degenerative joint disease. PMH significant for Lt THA anterior approach on 01/22/22, COPD, DM, depression, HLD, HTN, OA, back pain, Lt TKA in 2005.   Clinical Impression  Pt presents with condition above and deficits mentioned below, see PT Problem List. PTA, he was living with his husband in a 1-level house with a ramped entrance. Since his recent L TKA, pt has progressed with mobility to a level where he was able to perform bed mobility and ambulate up to ~50 ft with a bari-RW without assistance. He would still occasionally need assistance for transfers. His husband works from home and can assist as needed. Currently, pt is primarily limited by nausea, dizziness, and R hip pain. Pt's BP was 155/81 after sitting EOB several min then 140/102 sitting after standing with HR at 83 and SpO2 at 99% on RA. Currently, pt is requiring maxA for bed mobility and minA to transfer to stand and take a few steps anterior <> posterior at EOB with a RW. Will continue to follow acutely to maximize pt's return to his baseline and progress his mobility.       Recommendations for follow up therapy are one component of a multi-disciplinary discharge planning process, led by the attending physician.  Recommendations may be updated based on patient status, additional functional criteria and insurance authorization.  Follow Up Recommendations Follow physician's recommendations for discharge plan and follow up therapies    Assistance Recommended at Discharge Intermittent Supervision/Assistance  Patient can return home with the following  A little help with walking and/or transfers;Assist for transportation;Help with stairs or ramp for entrance;Assistance with cooking/housework;A  lot of help with bathing/dressing/bathroom    Equipment Recommendations None recommended by PT  Recommendations for Other Services       Functional Status Assessment Patient has had a recent decline in their functional status and demonstrates the ability to make significant improvements in function in a reasonable and predictable amount of time.     Precautions / Restrictions Precautions Precautions: Fall;Other (comment) Precaution Comments: watch BP; wound vac R hip Restrictions Weight Bearing Restrictions: Yes Other Position/Activity Restrictions: WBAT R leg      Mobility  Bed Mobility Overal bed mobility: Needs Assistance Bed Mobility: Supine to Sit, Sit to Supine     Supine to sit: Max assist, HOB elevated, +2 for safety/equipment Sit to supine: Max assist, +2 for safety/equipment, +2 for physical assistance   General bed mobility comments: Husband present and pt requesting husband's assistance to come up to sit with HHA to ascend trunk, appeared to need maxA. Extra time and cues for managing legs with assistance from PT. MaxA to return to supine, husband assisting trunk and PT assisting legs    Transfers Overall transfer level: Needs assistance Equipment used: Rolling walker (2 wheels) Transfers: Sit to/from Stand Sit to Stand: Min assist           General transfer comment: Pt needing minA to power up to stand 2x from EOB to RW. Cues provided for hand placement    Ambulation/Gait Ambulation/Gait assistance: Min assist Gait Distance (Feet): 8 Feet Assistive device: Rolling walker (2 wheels) Gait Pattern/deviations: Decreased step length - right, Decreased step length - left, Decreased stance time - right, Decreased weight shift to right, Trunk flexed, Wide base of support,  Antalgic, Shuffle Gait velocity: reduced Gait velocity interpretation: <1.31 ft/sec, indicative of household ambulator   General Gait Details: Pt with very slow, shuffling antalgic gait  pattern with wide BOS, needing minA for stability and cues to bring RW posteriorly when stepping posteriorly. Shakiness noted, but no LOB  Stairs            Wheelchair Mobility    Modified Rankin (Stroke Patients Only)       Balance Overall balance assessment: Needs assistance Sitting-balance support: No upper extremity supported, Single extremity supported, Feet supported Sitting balance-Leahy Scale: Fair Sitting balance - Comments: Able to sit statically with hands in lap without LOB but would place hand on bed for pain relief.   Standing balance support: Bilateral upper extremity supported, During functional activity, Reliant on assistive device for balance Standing balance-Leahy Scale: Poor Standing balance comment: Reliant on RW and up to minA                             Pertinent Vitals/Pain Pain Assessment Pain Assessment: Faces Faces Pain Scale: Hurts whole lot Pain Location: R hip Pain Descriptors / Indicators: Discomfort, Grimacing, Operative site guarding Pain Intervention(s): Limited activity within patient's tolerance, Monitored during session, Premedicated before session, Repositioned, Ice applied    Home Living Family/patient expects to be discharged to:: Private residence Living Arrangements: Spouse/significant other Available Help at Discharge: Family;Available 24 hours/day (husband works from home) Type of Home: House Home Access: Ramped entrance       Home Layout: One level Home Equipment: Wheelchair - Surveyor, quantity (2 wheels);Rollator (4 wheels);Grab bars - tub/shower;Hand held shower head;Grab bars - toilet;Adaptive equipment;Wheelchair - manual;BSC/3in1;Shower seat - built in      Prior Function Prior Level of Function : Needs assist             Mobility Comments: Since his recent L THA, he has been able to perform bed mobility without assistance and ambulate up to ~50 ft with a RW without assistance. He occasionally  would need help with transfers. ADLs Comments: Spouse helps as needed with ADLs     Hand Dominance        Extremity/Trunk Assessment   Upper Extremity Assessment Upper Extremity Assessment: Overall WFL for tasks assessed    Lower Extremity Assessment Lower Extremity Assessment: RLE deficits/detail RLE Deficits / Details: Pain s/p THA impacting strength, ROM, and weight bearing tolerance    Cervical / Trunk Assessment Cervical / Trunk Assessment: Kyphotic  Communication   Communication: No difficulties  Cognition Arousal/Alertness: Awake/alert Behavior During Therapy: WFL for tasks assessed/performed Overall Cognitive Status: Within Functional Limits for tasks assessed                                 General Comments: Pt reporting being a little off compared to his normal as he was nauseated and dizzy and not feeling great        General Comments General comments (skin integrity, edema, etc.): BP 155/81 after sitting EOB a few min, 140/102 sitting after standing, HR 83, SpO2 99% on RA    Exercises     Assessment/Plan    PT Assessment Patient needs continued PT services  PT Problem List Decreased strength;Decreased range of motion;Decreased activity tolerance;Decreased balance;Decreased mobility;Pain       PT Treatment Interventions DME instruction;Gait training;Functional mobility training;Therapeutic activities;Therapeutic exercise;Balance training;Neuromuscular re-education;Patient/family education    PT  Goals (Current goals can be found in the Care Plan section)  Acute Rehab PT Goals Patient Stated Goal: to get better and walk better PT Goal Formulation: With patient/family Time For Goal Achievement: 05/10/22 Potential to Achieve Goals: Good    Frequency 7X/week (attempt BID)     Co-evaluation               AM-PAC PT "6 Clicks" Mobility  Outcome Measure Help needed turning from your back to your side while in a flat bed without using  bedrails?: A Lot Help needed moving from lying on your back to sitting on the side of a flat bed without using bedrails?: A Lot Help needed moving to and from a bed to a chair (including a wheelchair)?: A Little Help needed standing up from a chair using your arms (e.g., wheelchair or bedside chair)?: A Little Help needed to walk in hospital room?: Total (did not walk 20 ft today) Help needed climbing 3-5 steps with a railing? : Total 6 Click Score: 12    End of Session   Activity Tolerance: Patient tolerated treatment well Patient left: in bed;with call bell/phone within reach;with bed alarm set;with family/visitor present Nurse Communication: Mobility status;Other (comment) (nausea, dizziness, vitals) PT Visit Diagnosis: Unsteadiness on feet (R26.81);Other abnormalities of gait and mobility (R26.89);Muscle weakness (generalized) (M62.81);Difficulty in walking, not elsewhere classified (R26.2);Pain Pain - Right/Left: Right Pain - part of body: Hip    Time: [id number] PT Time Calculation (min) (ACUTE ONLY): 40 min   Charges:   PT Evaluation $PT Eval Moderate Complexity: 1 Mod PT Treatments $Therapeutic Activity: 23-37 mins        Raymond GurneyAnessa Pettis, PT, DPT Acute Rehabilitation Services  Pager: [phone] Office: [phone]   Jewel Baizenessa M Pettis 05/03/2022, 4:55 PM

## 2022-05-04 ENCOUNTER — Encounter (HOSPITAL_COMMUNITY): Payer: Self-pay | Admitting: Orthopaedic Surgery

## 2022-05-04 DIAGNOSIS — M1611 Unilateral primary osteoarthritis, right hip: Secondary | ICD-10-CM | POA: Diagnosis not present

## 2022-05-04 LAB — CBC
HCT: 39.9 % (ref 39.0–52.0)
Hemoglobin: 13.1 g/dL (ref 13.0–17.0)
MCH: 30.3 pg (ref 26.0–34.0)
MCHC: 32.8 g/dL (ref 30.0–36.0)
MCV: 92.1 fL (ref 80.0–100.0)
Platelets: 239 10*3/uL (ref 150–400)
RBC: 4.33 MIL/uL (ref 4.22–5.81)
RDW: 13.6 % (ref 11.5–15.5)
WBC: 11.8 10*3/uL — ABNORMAL HIGH (ref 4.0–10.5)
nRBC: 0 % (ref 0.0–0.2)

## 2022-05-04 LAB — GLUCOSE, CAPILLARY
Glucose-Capillary: 179 mg/dL — ABNORMAL HIGH (ref 70–99)
Glucose-Capillary: 216 mg/dL — ABNORMAL HIGH (ref 70–99)

## 2022-05-04 NOTE — Progress Notes (Addendum)
Subjective: 1 Day Post-Op Procedure(s) (LRB): RIGHT TOTAL HIP ARTHROPLASTY ANTERIOR APPROACH (Right) Patient reports pain as moderate.  Has had about 3-4 episodes of vomiting post-op.  Denies any chest pain/pressure/palpitations/sob.   Objective: Vital signs in last 24 hours: Temp:  [98 F (36.7 C)-98.9 F (37.2 C)] 98.2 F (36.8 C) (06/02 0404) Pulse Rate:  [54-95] 77 (06/02 0404) Resp:  [13-20] 16 (06/02 0404) BP: (100-156)/(49-102) 123/59 (06/02 0404) SpO2:  [95 %-100 %] 98 % (06/02 0404) Weight:  [111.1 kg] 111.1 kg (06/01 0908)  Intake/Output from previous day: 06/01 0701 - 06/02 0700 In: 2056 [I.V.:1956; IV Piggyback:100] Out: 2026 [Urine:1825; Emesis/NG output:1; Blood:200] Intake/Output this shift: No intake/output data recorded.  Recent Labs    05/04/22 0628  HGB 13.1   Recent Labs    05/04/22 0628  WBC 11.8*  RBC 4.33  HCT 39.9  PLT 239   No results for input(s): NA, K, CL, CO2, BUN, CREATININE, GLUCOSE, CALCIUM in the last 72 hours. No results for input(s): LABPT, INR in the last 72 hours.  Neurologically intact Neurovascular intact Sensation intact distally Intact pulses distally Dorsiflexion/Plantar flexion intact Incision: dressing C/D/I No cellulitis present Compartment soft Wound vac functioning well without fluid in canister   Assessment/Plan: 1 Day Post-Op Procedure(s) (LRB): RIGHT TOTAL HIP ARTHROPLASTY ANTERIOR APPROACH (Right) Advance diet Up with therapy Discharge home with home health likely not until tomorrow, but possibility for later today as long as he is feeling much better and mobilizes well with PT WBAT RLE Wound vac- please swap out home unit for portable prevena, but NOT until day of discharge.        Cristie Hem 05/04/2022, 7:54 AM

## 2022-05-04 NOTE — Discharge Summary (Signed)
Patient ID: Kent Rodriguez MRN: [id number] DOB/AGE: 1945-12-15 76 y.o.  Admit date: 05/03/2022 Discharge date: 05/04/2022  Admission Diagnoses:  Principal Problem:   Primary osteoarthritis of right hip Active Problems:   Status post total replacement of right hip   Discharge Diagnoses:  Same  Past Medical History:  Diagnosis Date   Back pain    lower back   COPD (chronic obstructive pulmonary disease) ([place])    Dependent on wheelchair    Depression    Diabetes mellitus without complication ([place])    type 2   HLD (hyperlipidemia)    Hypertension    Insomnia    Osteoarthritis of left hip    Seasonal allergies    Sleep apnea    uses CPAP nightly   Spondylolysis of cervical region    Spondylolysis of lumbar region     Surgeries: Procedure(s): RIGHT TOTAL HIP ARTHROPLASTY ANTERIOR APPROACH on 05/03/2022   Consultants:   Discharged Condition: Improved  Hospital Course: Kent Rodriguez is an 76 y.o. male who was admitted 05/03/2022 for operative treatment ofPrimary osteoarthritis of right hip. Patient has severe unremitting pain that affects sleep, daily activities, and work/hobbies. After pre-op clearance the patient was taken to the operating room on 05/03/2022 and underwent  Procedure(s): RIGHT TOTAL HIP ARTHROPLASTY ANTERIOR APPROACH.    Patient was given perioperative antibiotics:  Anti-infectives (From admission, onward)    Start     Dose/Rate Route Frequency Ordered Stop   05/03/22 1600  ceFAZolin (ANCEF) IVPB 2g/100 mL premix        2 g 200 mL/hr over 30 Minutes Intravenous Every 6 hours 05/03/22 1545 05/03/22 2233   05/03/22 1221  vancomycin (VANCOCIN) powder  Status:  Discontinued          As needed 05/03/22 1222 05/03/22 1413   05/03/22 0900  ceFAZolin (ANCEF) IVPB 2g/100 mL premix        2 g 200 mL/hr over 30 Minutes Intravenous On call to O.R. 05/03/22 [id number] 05/03/22 1142        Patient was given sequential compression devices, early ambulation, and  chemoprophylaxis to prevent DVT.  Patient benefited maximally from hospital stay and there were no complications.    Recent vital signs: Patient Vitals for the past 24 hrs:  BP Temp Temp src Pulse Resp SpO2 Height Weight  05/04/22 0404 (!) 123/59 98.2 F (36.8 C) Oral 77 16 98 % -- --  05/04/22 0015 (!) 100/51 98.9 F (37.2 C) Oral 66 18 96 % -- --  05/03/22 2001 (!) 125/54 98 F (36.7 C) Oral 84 18 95 % -- --  05/03/22 1735 (!) 144/65 98.2 F (36.8 C) Oral 95 18 96 % -- --  05/03/22 1626 (!) 140/102 -- -- 86 18 99 % -- --  05/03/22 1543 (!) 156/82 98 F (36.7 C) -- 82 18 100 % -- --  05/03/22 1528 (!) 149/49 -- -- 62 18 98 % -- --  05/03/22 1513 (!) 152/63 -- -- 73 14 95 % -- --  05/03/22 1458 (!) 137/99 -- -- 67 13 97 % -- --  05/03/22 1443 110/60 -- -- 63 20 98 % -- --  05/03/22 1428 (!) 113/57 -- -- (!) 54 17 97 % -- --  05/03/22 1413 126/60 98 F (36.7 C) -- 62 13 100 % -- --  05/03/22 0908 125/62 98.4 F (36.9 C) Oral 60 18 98 % '5\' 6"'  (1.676 m) 111.1 kg     Recent laboratory studies:  Recent Labs  05/04/22 0628  WBC 11.8*  HGB 13.1  HCT 39.9  PLT 239     Discharge Medications:   Allergies as of 05/04/2022       Reactions   Oxycodone Anxiety, Palpitations, Shortness Of Breath   Gabapentin Rash   Gemfibrozil Hives, Itching, Rash        Medication List     STOP taking these medications    meloxicam 15 MG tablet Commonly known as: MOBIC       TAKE these medications    amoxicillin 500 MG tablet Commonly known as: AMOXIL Take 2,000 mg by mouth See admin instructions. Take 2000 mg by mouth prior to dental appointments   aspirin EC 81 MG tablet Take 1 tablet (81 mg total) by mouth in the morning and at bedtime. To be taken after surgery to prevent blood clots   cholecalciferol 25 MCG (1000 UNIT) tablet Commonly known as: VITAMIN D3 Take 1,000 Units by mouth daily.   docusate sodium 100 MG capsule Commonly known as: Colace Take 1 capsule (100  mg total) by mouth daily as needed. What changed:  when to take this Another medication with the same name was removed. Continue taking this medication, and follow the directions you see here.   fluticasone 50 MCG/ACT nasal spray Commonly known as: FLONASE Place 1 spray into both nostrils daily as needed for allergies or rhinitis.   HYDROcodone-acetaminophen 7.5-325 MG tablet Commonly known as: Norco Take 1-2 tablets by mouth every 6 (six) hours as needed for moderate pain. To be taken after surgery   lisinopril-hydrochlorothiazide 10-12.5 MG tablet Commonly known as: ZESTORETIC Take 1 tablet by mouth daily.   metFORMIN 500 MG 24 hr tablet Commonly known as: GLUCOPHAGE-XR Take 1,000 mg by mouth every evening.   methocarbamol 750 MG tablet Commonly known as: Robaxin-750 Take 1 tablet (750 mg total) by mouth 2 (two) times daily as needed for muscle spasms.   metoprolol succinate 50 MG 24 hr tablet Commonly known as: TOPROL-XL Take 50 mg by mouth daily.   morphine 30 MG 12 hr tablet Commonly known as: MS CONTIN Take 30 mg by mouth every 12 (twelve) hours as needed for pain.   naloxone 4 MG/0.1ML Liqd nasal spray kit Commonly known as: NARCAN Place 1 spray into the nose as needed (opiate overdose).   nystatin-triamcinolone cream Commonly known as: MYCOLOG II Apply 1 application topically 2 (two) times daily as needed for rash.   ondansetron 4 MG tablet Commonly known as: Zofran Take 1 tablet (4 mg total) by mouth every 8 (eight) hours as needed for nausea or vomiting.   senna 8.6 MG Tabs tablet Commonly known as: SENOKOT Take 1 tablet by mouth in the morning.   simvastatin 20 MG tablet Commonly known as: ZOCOR Take 20 mg by mouth at bedtime.   sulfamethoxazole-trimethoprim 800-160 MG tablet Commonly known as: BACTRIM DS Take 1 tablet by mouth 2 (two) times daily.   traZODone 50 MG tablet Commonly known as: DESYREL Take 50 mg by mouth at bedtime as needed for  sleep.   zolpidem 10 MG tablet Commonly known as: AMBIEN Take 10 mg by mouth at bedtime.               Durable Medical Equipment  (From admission, onward)           Start     Ordered   05/03/22 1546  DME Walker rolling  Once       Question:  Patient needs a walker  to treat with the following condition  Answer:  History of hip replacement   05/03/22 1545   05/03/22 1546  DME 3 n 1  Once        05/03/22 1545   05/03/22 1546  DME Bedside commode  Once       Question:  Patient needs a bedside commode to treat with the following condition  Answer:  History of hip replacement   05/03/22 1545            Diagnostic Studies: DG Pelvis Portable  Result Date: 05/03/2022 CLINICAL DATA:  Hip joint replacement EXAM: PORTABLE PELVIS 1-2 VIEWS COMPARISON:  03/06/2022 FINDINGS: Postsurgical changes of right total hip arthroplasty. Intact hardware without evidence of loosening or periprosthetic fracture. Normal alignment. Expected soft tissue changes. Unchanged left total hip arthroplasty. IMPRESSION: Right total hip arthroplasty without evidence of immediate hardware complication. Electronically Signed   By: Maurine Simmering M.D.   On: 05/03/2022 15:05   DG C-Arm 1-60 Min-No Report  Result Date: 05/03/2022 Fluoroscopy was utilized by the requesting physician.  No radiographic interpretation.   DG C-Arm 1-60 Min-No Report  Result Date: 05/03/2022 Fluoroscopy was utilized by the requesting physician.  No radiographic interpretation.   DG HIP UNILAT WITH PELVIS 1V RIGHT  Result Date: 05/03/2022 CLINICAL DATA:  Right total hip arthroplasty.  Anterior approach. EXAM: OPERATIVE right HIP (WITH PELVIS IF PERFORMED) 6 VIEWS TECHNIQUE: Fluoroscopic spot image(s) were submitted for interpretation post-operatively. COMPARISON:  Pelvis radiographs 03/06/2022. FINDINGS: Advanced degenerative changes are again noted in the native hip. Intraoperative images demonstrate placement of a right total hip  arthroplasty. Hip a peers located on this single view. No new fractures are present. IMPRESSION: Right total hip arthroplasty without radiographic evidence for complication. Electronically Signed   By: San Morelle M.D.   On: 05/03/2022 13:35    Disposition: Discharge disposition: 01-Home or Self Care          Follow-up Information     Leandrew Koyanagi, MD. Schedule an appointment as soon as possible for a visit in 2 week(s).   Specialty: Orthopedic Surgery Contact information: [address] [place] [address] [phone]                  Signed: Aundra Dubin 05/04/2022, 7:57 AM

## 2022-05-04 NOTE — Progress Notes (Signed)
[facility] Levels to be D/C'd  per MD order.  Discussed with the patient and all questions fully answered.  VSS, Skin clean, dry and intact without evidence of skin break down, no evidence of skin tears noted.  IV catheter discontinued intact. Site without signs and symptoms of complications. Dressing and pressure applied.  An After Visit Summary was printed and given to the patient.   D/c education completed with patient/family including follow up instructions, medication list, d/c activities limitations if indicated, with other d/c instructions as indicated by MD - patient able to verbalize understanding, all questions fully answered.   Patient instructed to return to ED, call 911, or call MD for any changes in condition.   Patient to be escorted via WC, and D/C home via private auto.

## 2022-05-04 NOTE — Progress Notes (Signed)
PT Cancellation Note  Patient Details Name: Phineas Mcenroe MRN: [id number] DOB: 13-Jun-1946   Cancelled Treatment:    Reason Eval/Treat Not Completed: Patient declined, no reason specified, pt declining all mobility x2 attempts this AM. During second attempt pt stating spouse will arrive in afternoon and requesting to wait until he is here. Will check back to continue with PT POC.    Marlana Salvage Jarell Mcewen 05/04/2022, 10:55 AM

## 2022-05-04 NOTE — TOC Initial Note (Signed)
Transition of Care [facility]) - Initial/Assessment Note    Patient Details  Name: Kent Rodriguez MRN: [id number] Date of Birth: [birth year]  Transition of Care [facility]) CM/SW Contact:    Kingsley Plan, RN Phone Number: 05/04/2022, 10:00 AM  Clinical Narrative:                  Spoke to patient at bedside. Confirmed face sheet information.   Patient from home with spouse.   Patient has motorized wheel chair at bedside, and walker and 3 in 1 at home.   PT recommending HHPT. Patient in agreement.   Elnita Maxwell with [facility] unable to accept due t insurance.   [facility] does not cover patient's address.   Dondra Spry at [facility] unable to accept due to insurance.   [facility] unable to accept due to staffing  [facility] unable to accept due to insurance.   Tresa Endo at [facility] [phone] fax [phone] can accept. Clinicals and orders faxed to [facility] Expected Discharge Plan: Home w Home Health Services Barriers to Discharge: No Barriers Identified   Patient Goals and CMS Choice Patient states their goals for this hospitalization and ongoing recovery are:: to return to home CMS Medicare.gov Compare Post Acute Care list provided to:: Patient Choice offered to / list presented to : Patient  Expected Discharge Plan and Services Expected Discharge Plan: Home w Home Health Services   Discharge Planning Services: CM Consult Post Acute Care Choice: Home Health Living arrangements for the past 2 months: Single Family Home Expected Discharge Date: 05/04/22               DME Arranged: N/A DME Agency: NA       HH Arranged: PT HH Agency: Other - See comment [facility]) Date HH Agency Contacted: 05/04/22 Time HH Agency Contacted: 0900 Representative spoke with at [facility] Agency: Tresa Endo  Prior Living Arrangements/Services Living arrangements for the past 2 months: Single Family Home Lives with:: Spouse Patient language and need for interpreter reviewed:: Yes Do you feel  safe going back to the place where you live?: Yes      Need for Family Participation in Patient Care: Yes (Comment) Care giver support system in place?: Yes (comment) Current home services: DME Criminal Activity/Legal Involvement Pertinent to Current Situation/Hospitalization: No - Comment as needed  Activities of Daily Living      Permission Sought/Granted   Permission granted to share information with : No              Emotional Assessment Appearance:: Appears stated age Attitude/Demeanor/Rapport: Engaged Affect (typically observed): Accepting Orientation: : Oriented to Self, Oriented to Place, Oriented to  Time, Oriented to Situation Alcohol / Substance Use: Not Applicable Psych Involvement: No (comment)  Admission diagnosis:  Status post total replacement of right hip [Z96.641] Patient Active Problem List   Diagnosis Date Noted   Status post total replacement of right hip 05/03/2022   Primary osteoarthritis of right hip 03/06/2022   Primary osteoarthritis of left hip 01/22/2022   Status post total replacement of left hip 01/22/2022   High blood cholesterol 11/06/2021   Insomnia 11/06/2021   Diabetes mellitus without complication (HCC) 07/16/2019   Essential hypertension 07/16/2019   Primary osteoarthritis involving multiple joints 07/16/2019   Screening for prostate cancer 07/16/2019   Obstructive sleep apnea (adult) (pediatric) 02/26/2019   Morbid (severe) obesity due to excess calories (HCC) 02/26/2019   Cervical spondylosis 02/02/2019   Lumbar spondylosis 02/02/2019   Depression, unspecified  02/02/2019   Dyspnea on exertion 02/02/2019   Hand pain 02/02/2019   Chest pain with high risk for cardiac etiology 02/02/2019   Hip pain 02/02/2019   Knee pain 02/02/2019   Localized, primary osteoarthritis 02/02/2019   Low back pain 02/02/2019   Neck pain 02/02/2019   Osteoarthritis of knee 02/02/2019   PCP:  Gaspar Skeeters, MD Pharmacy:   [facility]  [phone] - [place], [place] - [address] AT [facility] [address] [place] [address] Phone: [phone] Fax: [phone]  [facility] [id number] - [place], [place] - [address] [place] [address] Phone: [phone] Fax: [phone]     Social Determinants of Health (SDOH) Interventions    Readmission Risk Interventions     View : No data to display.

## 2022-05-04 NOTE — Progress Notes (Addendum)
Physical Therapy Treatment Patient Details Name: Kent Rodriguez MRN: [id number] DOB: Jun 12, 1946 Today's Date: 05/04/2022   History of Present Illness Patient is 76 y.o. male who presented 05/03/22 for R THA anterior approach secondary to R hip degenerative joint disease. PMH significant for Lt THA anterior approach on 01/22/22, COPD, DM, depression, HLD, HTN, OA, back pain, Lt TKA in 2005.    PT Comments    Pt received supine and agreeable to session with pt stating eagerness to go home. Pt able to come to sitting EOB with min assist from spouse to elevate trunk as spouse will be present at home to assist and has been assisting PTA. Pt able to come to standing with min guard assist for safety from EOB and personal power chair and demonstrate ambulation in room with min guard assist without LOB. Pt able to demonstrate use of all features of powerchair and negotiate through room and hallway without fault. Anticipate safe discharge with spouse assistance once medically cleared, will follow acutely. Pt continues to benefit from skilled PT services to progress toward functional mobility goals.     Recommendations for follow up therapy are one component of a multi-disciplinary discharge planning process, led by the attending physician.  Recommendations may be updated based on patient status, additional functional criteria and insurance authorization.  Follow Up Recommendations  Follow physician's recommendations for discharge plan and follow up therapies     Assistance Recommended at Discharge Intermittent Supervision/Assistance  Patient can return home with the following A little help with walking and/or transfers;Assist for transportation;Help with stairs or ramp for entrance;Assistance with cooking/housework;A lot of help with bathing/dressing/bathroom   Equipment Recommendations  None recommended by PT    Recommendations for Other Services       Precautions / Restrictions  Precautions Precautions: Fall;Other (comment) Precaution Comments: watch BP; wound vac R hip Restrictions Weight Bearing Restrictions: Yes RLE Weight Bearing: Weight bearing as tolerated Other Position/Activity Restrictions: WBAT R leg     Mobility  Bed Mobility Overal bed mobility: Needs Assistance Bed Mobility: Supine to Sit     Supine to sit: HOB elevated, Min assist     General bed mobility comments: Husband present and pt requesting husband's assistance to come up to sit with HHA to ascend trunk, appeared to need min a. Pt and husband stating bed mobility back to baseline    Transfers Overall transfer level: Needs assistance Equipment used: Rolling walker (2 wheels) Transfers: Sit to/from Stand Sit to Stand: Min guard           General transfer comment: min gaurd for safety to stand from EOB and power chiar    Ambulation/Gait Ambulation/Gait assistance: Min guard Gait Distance (Feet): 15 Feet Assistive device: Rolling walker (2 wheels) Gait Pattern/deviations: Decreased step length - right, Decreased step length - left, Decreased stance time - right, Decreased weight shift to right, Trunk flexed, Wide base of support, Antalgic, Shuffle Gait velocity: reduced     General Gait Details: shuffling gait with wide BOS, cues for upright trunk and forward gaze. miin guard for safety   Stairs             Wheelchair Mobility    Modified Rankin (Stroke Patients Only)       Balance Overall balance assessment: Needs assistance Sitting-balance support: No upper extremity supported, Single extremity supported, Feet supported Sitting balance-Leahy Scale: Fair Sitting balance - Comments: Able to sit statically with hands in lap without LOB but would place hand on bed for pain  relief.   Standing balance support: Bilateral upper extremity supported, During functional activity, Reliant on assistive device for balance Standing balance-Leahy Scale: Poor Standing  balance comment: Reliant on RW and up to [employer] Arousal/Alertness: Awake/alert Behavior During Therapy: WFL for tasks assessed/performed Overall Cognitive Status: Within Functional Limits for tasks assessed                                 General Comments: pt stating he is ready to go home        Exercises      General Comments General comments (skin integrity, edema, etc.): pt able to manage all power chair parts and features without assist and navigate chair in room and hall without fault, pt and husband stating pt very close to baseline assist level      Pertinent Vitals/Pain Pain Assessment Pain Assessment: Faces Faces Pain Scale: Hurts a little bit Pain Location: R hip Pain Descriptors / Indicators: Discomfort, Grimacing, Operative site guarding Pain Intervention(s): Monitored during session, Limited activity within patient's tolerance, Repositioned    Home Living                          Prior Function            PT Goals (current goals can now be found in the care plan section) Acute Rehab PT Goals Patient Stated Goal: to go home Time For Goal Achievement: 05/10/22 Potential to Achieve Goals: Good    Frequency    7X/week (attempt BID)      PT Plan      Co-evaluation              AM-PAC PT "6 Clicks" Mobility   Outcome Measure  Help needed turning from your back to your side while in a flat bed without using bedrails?: A Lot Help needed moving from lying on your back to sitting on the side of a flat bed without using bedrails?: A Lot Help needed moving to and from a bed to a chair (including a wheelchair)?: A Little Help needed standing up from a chair using your arms (e.g., wheelchair or bedside chair)?: A Little Help needed to walk in hospital room?: A Little (did not walk 20 ft today) Help needed climbing 3-5 steps with a railing? : Total 6 Click Score: 14    End of  Session Equipment Utilized During Treatment: Gait belt Activity Tolerance: Patient tolerated treatment well Patient left: in bed;with call bell/phone within reach;with family/visitor present (seated EOB) Nurse Communication: Mobility status PT Visit Diagnosis: Unsteadiness on feet (R26.81);Other abnormalities of gait and mobility (R26.89);Muscle weakness (generalized) (M62.81);Difficulty in walking, not elsewhere classified (R26.2);Pain Pain - Right/Left: Right Pain - part of body: Hip     Time: [id number] PT Time Calculation (min) (ACUTE ONLY): 30 min  Charges:  $Gait Training: 8-22 mins $Therapeutic Activity: 8-22 mins                     Lavonte Palos R. PTA Acute Rehabilitation Services Office: [place] 05/04/2022, 2:05 PM

## 2022-05-07 ENCOUNTER — Encounter (HOSPITAL_COMMUNITY): Payer: Self-pay | Admitting: Orthopaedic Surgery

## 2022-05-10 ENCOUNTER — Ambulatory Visit: Payer: BC Managed Care – PPO | Admitting: Physical Medicine & Rehabilitation

## 2022-05-11 ENCOUNTER — Ambulatory Visit (INDEPENDENT_AMBULATORY_CARE_PROVIDER_SITE_OTHER): Payer: BC Managed Care – PPO | Admitting: Orthopaedic Surgery

## 2022-05-11 DIAGNOSIS — Z96641 Presence of right artificial hip joint: Secondary | ICD-10-CM

## 2022-05-11 MED ORDER — CEPHALEXIN 500 MG PO CAPS
500.0000 mg | ORAL_CAPSULE | Freq: Four times a day (QID) | ORAL | 0 refills | Status: DC
Start: 2022-05-11 — End: 2022-08-15

## 2022-05-11 NOTE — Progress Notes (Signed)
Post-Op Visit Note   Patient: Kent Rodriguez           Date of Birth: 1946/04/26           MRN: [id number] Visit Date: 05/11/2022 PCP: Leone Haven, MD   Assessment & Plan:  Chief Complaint:  Chief Complaint  Patient presents with   Right Hip - Routine Post Op   Visit Diagnoses:  1. Status post total replacement of right hip     Plan: Patient is 1 week status post right total hip replacement.  He is here for removal of incisional wound VAC.  He has no complaints other than skin irritation to the adhesive.  He started home health PT on Saturday.  Examination of right hip shows an intact surgical incision.  No drainage or signs of infection.  He has some surrounding dermatitis due to reaction to the adhesive.  Neurovascular intact distally.  A Mepilex dressing was placed today.  Continue PT.  Recheck next week for suture removal.  Follow-Up Instructions: No follow-ups on file.   Orders:  No orders of the defined types were placed in this encounter.  No orders of the defined types were placed in this encounter.   Imaging: No results found.  PMFS History: Patient Active Problem List   Diagnosis Date Noted   Status post total replacement of right hip 05/03/2022   Primary osteoarthritis of right hip 03/06/2022   Primary osteoarthritis of left hip 01/22/2022   Status post total replacement of left hip 01/22/2022   High blood cholesterol 11/06/2021   Insomnia 11/06/2021   Diabetes mellitus without complication ([place]) [id number]   Essential hypertension 07/16/2019   Primary osteoarthritis involving multiple joints 07/16/2019   Screening for prostate cancer 07/16/2019   Obstructive sleep apnea (adult) (pediatric) 02/26/2019   Morbid (severe) obesity due to excess calories ([place]) 02/26/2019   Cervical spondylosis 02/02/2019   Lumbar spondylosis 02/02/2019   Depression, unspecified 02/02/2019   Dyspnea on exertion 02/02/2019   Hand pain 02/02/2019   Chest pain  with high risk for cardiac etiology 02/02/2019   Hip pain 02/02/2019   Knee pain 02/02/2019   Localized, primary osteoarthritis 02/02/2019   Low back pain 02/02/2019   Neck pain 02/02/2019   Osteoarthritis of knee 02/02/2019   Past Medical History:  Diagnosis Date   Back pain    lower back   COPD (chronic obstructive pulmonary disease) ([place])    Dependent on wheelchair    Depression    Diabetes mellitus without complication ([place])    type 2   HLD (hyperlipidemia)    Hypertension    Insomnia    Osteoarthritis of left hip    Seasonal allergies    Sleep apnea    uses CPAP nightly   Spondylolysis of cervical region    Spondylolysis of lumbar region     No family history on file.  Past Surgical History:  Procedure Laterality Date   CARDIAC CATHETERIZATION  2003   COLONOSCOPY     several x 3   JOINT REPLACEMENT  2015   left knee   PALATE SURGERY  2008   benign   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   Procedure: LEFT TOTAL HIP ARTHROPLASTY ANTERIOR APPROACH;  Surgeon: Leandrew Koyanagi, MD;  Location: [facility];  Service: Orthopedics;  Laterality: Left;  3-C   TOTAL HIP ARTHROPLASTY Right 05/03/2022   Procedure: RIGHT TOTAL HIP ARTHROPLASTY ANTERIOR APPROACH;  Surgeon: Leandrew Koyanagi, MD;  Location: [facility];  Service: Orthopedics;  Laterality: Right;   UPPER GI ENDOSCOPY     several - stomach ulcers   Social History   Occupational History   Not on file  Tobacco Use   Smoking status: Former    Packs/day: 1.00    Years: 20.00    Total pack years: 20.00    Types: Cigarettes    Quit date: 08/24/1991    Years since quitting: 30.7   Smokeless tobacco: Never  Vaping Use   Vaping Use: Never used  Substance and Sexual Activity   Alcohol use: Not Currently    Comment: wine   Drug use: Never   Sexual activity: Not Currently

## 2022-05-18 ENCOUNTER — Ambulatory Visit (INDEPENDENT_AMBULATORY_CARE_PROVIDER_SITE_OTHER): Payer: BC Managed Care – PPO | Admitting: Physician Assistant

## 2022-05-18 DIAGNOSIS — Z96641 Presence of right artificial hip joint: Secondary | ICD-10-CM

## 2022-05-18 MED ORDER — HYDROCODONE-ACETAMINOPHEN 7.5-325 MG PO TABS: 1.0000 | ORAL_TABLET | Freq: Four times a day (QID) | ORAL | 0 refills | Status: DC | PRN

## 2022-05-18 NOTE — Progress Notes (Addendum)
Post-Op Visit Note   Patient: Kent Rodriguez           Date of Birth: 1946-12-03           MRN: [id number] Visit Date: 05/18/2022 PCP: Gaspar Skeeters, MD   Assessment & Plan:  Chief Complaint:  Chief Complaint  Patient presents with   Right Hip - Routine Post Op   Visit Diagnoses:  1. History of total replacement of right hip     Plan: Patient is a pleasant 76 year old gentleman who comes in today 2 weeks status post right total hip replacement 05/03/2022.  He has been doing well and in minimal to moderate pain which is relieved with Norco.  He has been compliant taking his aspirin 81 mg twice daily for DVT prophylaxis.  He is getting home health physical therapy and is ambulating some with his wheelchair and some with a walker.  He notes that his right knee pain is the limiting factor there.  Examination of his right hip reveals a well-healed surgical incision without evidence of infection or cellulitis.  He does have some irritation around the incision from the bandage.  Calves are soft nontender.  Today, sutures were removed.  I do not want apply Steri-Strips as he has had irritation from multiple different bandages.  Dental prophylaxis reinforced.  Follow-up with [place] in 4 weeks time for repeat evaluation and AP pelvis x-rays.  Call with concerns or questions.  Follow-Up Instructions: Return in about 4 weeks (around 06/15/2022).   Orders:  No orders of the defined types were placed in this encounter.  No orders of the defined types were placed in this encounter.   Imaging: No new imaging  PMFS History: Patient Active Problem List   Diagnosis Date Noted   Status post total replacement of right hip 05/03/2022   Primary osteoarthritis of right hip 03/06/2022   Primary osteoarthritis of left hip 01/22/2022   Status post total replacement of left hip 01/22/2022   High blood cholesterol 11/06/2021   Insomnia 11/06/2021   Diabetes mellitus without complication (HCC)  07/16/2019   Essential hypertension 07/16/2019   Primary osteoarthritis involving multiple joints 07/16/2019   Screening for prostate cancer 07/16/2019   Obstructive sleep apnea (adult) (pediatric) 02/26/2019   Morbid (severe) obesity due to excess calories (HCC) 02/26/2019   Cervical spondylosis 02/02/2019   Lumbar spondylosis 02/02/2019   Depression, unspecified 02/02/2019   Dyspnea on exertion 02/02/2019   Hand pain 02/02/2019   Chest pain with high risk for cardiac etiology 02/02/2019   Hip pain 02/02/2019   Knee pain 02/02/2019   Localized, primary osteoarthritis 02/02/2019   Low back pain 02/02/2019   Neck pain 02/02/2019   Osteoarthritis of knee 02/02/2019   Past Medical History:  Diagnosis Date   Back pain    lower back   COPD (chronic obstructive pulmonary disease) (HCC)    Dependent on wheelchair    Depression    Diabetes mellitus without complication (HCC)    type 2   HLD (hyperlipidemia)    Hypertension    Insomnia    Osteoarthritis of left hip    Seasonal allergies    Sleep apnea    uses CPAP nightly   Spondylolysis of cervical region    Spondylolysis of lumbar region     No family history on file.  Past Surgical History:  Procedure Laterality Date   CARDIAC CATHETERIZATION  2003   COLONOSCOPY     several x 3   JOINT REPLACEMENT  2015   left knee   PALATE SURGERY  2008   benign   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   Procedure: LEFT TOTAL HIP ARTHROPLASTY ANTERIOR APPROACH;  Surgeon: Tarry Kos, MD;  Location: [facility];  Service: Orthopedics;  Laterality: Left;  3-C   TOTAL HIP ARTHROPLASTY Right 05/03/2022   Procedure: RIGHT TOTAL HIP ARTHROPLASTY ANTERIOR APPROACH;  Surgeon: Tarry Kos, MD;  Location: [facility];  Service: Orthopedics;  Laterality: Right;   UPPER GI ENDOSCOPY     several - stomach ulcers   Social History   Occupational History   Not on file  Tobacco Use   Smoking status: Former    Packs/day: 1.00    Years: 20.00    Total  pack years: 20.00    Types: Cigarettes    Quit date: 08/24/1991    Years since quitting: 30.7   Smokeless tobacco: Never  Vaping Use   Vaping Use: Never used  Substance and Sexual Activity   Alcohol use: Not Currently    Comment: wine   Drug use: Never   Sexual activity: Not Currently

## 2022-05-21 ENCOUNTER — Ambulatory Visit: Payer: BC Managed Care – PPO | Admitting: Physical Medicine and Rehabilitation

## 2022-06-15 ENCOUNTER — Ambulatory Visit (INDEPENDENT_AMBULATORY_CARE_PROVIDER_SITE_OTHER): Payer: BC Managed Care – PPO

## 2022-06-15 ENCOUNTER — Ambulatory Visit (INDEPENDENT_AMBULATORY_CARE_PROVIDER_SITE_OTHER): Payer: BC Managed Care – PPO | Admitting: Orthopaedic Surgery

## 2022-06-15 ENCOUNTER — Encounter: Payer: Self-pay | Admitting: Orthopaedic Surgery

## 2022-06-15 DIAGNOSIS — M1711 Unilateral primary osteoarthritis, right knee: Secondary | ICD-10-CM

## 2022-06-15 DIAGNOSIS — Z96641 Presence of right artificial hip joint: Secondary | ICD-10-CM

## 2022-06-15 NOTE — Progress Notes (Signed)
Office Visit Note   Patient: Kent Rodriguez           Date of Birth: 27-Feb-1946           MRN: [id number] Visit Date: 06/15/2022              Requested by: Gaspar Skeeters, MD [address] [place],  [place] [address] PCP: Gaspar Skeeters, MD   Assessment & Plan: Visit Diagnoses:  1. History of total replacement of right hip   2. Primary osteoarthritis of right knee     Plan: Impression is status post right total hip replacement doing well and right knee advanced degenerative joint disease.  In regards to the hip, he is doing well.  Continue to advance with activity as tolerated.  Regards to the knee, he does not get relief from cortisone injection and is not interested in this.  He would like to undergo total knee arthroplasty but with recently undergoing bilateral total hip replacements we would like to wait until at least next year in order to develop more strength and endurance.  He understands and agrees.  Follow-up in 6 weeks for repeat evaluation of the right total hip replacement.  Dental prophylaxis reinforced.  Call with concerns or questions.  Follow-Up Instructions: Return in about 6 weeks (around 07/27/2022).   Orders:  Orders Placed This Encounter  Procedures   XR Pelvis 1-2 Views   XR KNEE 3 VIEW RIGHT   No orders of the defined types were placed in this encounter.     Procedures: No procedures performed   Clinical Data: No additional findings.   Subjective: Chief Complaint  Patient presents with   Right Hip - Follow-up    Right total hip arthroplasty 05/03/2022    HPI patient is a pleasant 75 year old gentleman who comes in today 6 weeks status post right total hip replacement 05/03/2022.  He is also here with chronic right knee pain from underlying osteoarthritis.  Regards to the right hip, he is doing well and has no complaints.  Progressing nicely with walking.  In regards to the knee, he continues to have pain worse with activity which is  somewhat limiting his ambulation.  He has previously undergone cortisone injections in the past which stopped providing relief.  Review of Systems as detailed in HPI.  All others reviewed and are negative.   Objective: Vital Signs: There were no vitals taken for this visit.  Physical Exam well-developed well-nourished gentleman in no acute distress.  Alert and oriented x3.  Ortho Exam hip exam reveals painless hip flexion and logroll.  Right knee exam shows no effusion.  Range of motion 0 to 100 degrees.  Medial joint line tenderness.  Moderate patellofemoral crepitus.  He is neurovascular tact distally.  Specialty Comments:  No specialty comments available.  Imaging: XR KNEE 3 VIEW RIGHT  Result Date: 06/15/2022 X-rays demonstrate significant degenerative changes medial patellofemoral compartments  XR Pelvis 1-2 Views  Result Date: 06/15/2022 Well-seated prosthesis without complication    PMFS History: Patient Active Problem List   Diagnosis Date Noted   Status post total replacement of right hip 05/03/2022   Primary osteoarthritis of right hip 03/06/2022   Primary osteoarthritis of left hip 01/22/2022   Status post total replacement of left hip 01/22/2022   High blood cholesterol 11/06/2021   Insomnia 11/06/2021   Diabetes mellitus without complication (HCC) 07/16/2019   Essential hypertension 07/16/2019   Primary osteoarthritis involving multiple joints 07/16/2019   Screening for prostate cancer 07/16/2019  Obstructive sleep apnea (adult) (pediatric) 02/26/2019   Morbid (severe) obesity due to excess calories (HCC) 02/26/2019   Cervical spondylosis 02/02/2019   Lumbar spondylosis 02/02/2019   Depression, unspecified 02/02/2019   Dyspnea on exertion 02/02/2019   Hand pain 02/02/2019   Chest pain with high risk for cardiac etiology 02/02/2019   Hip pain 02/02/2019   Knee pain 02/02/2019   Localized, primary osteoarthritis 02/02/2019   Low back pain 02/02/2019    Neck pain 02/02/2019   Osteoarthritis of knee 02/02/2019   Past Medical History:  Diagnosis Date   Back pain    lower back   COPD (chronic obstructive pulmonary disease) (HCC)    Dependent on wheelchair    Depression    Diabetes mellitus without complication (HCC)    type 2   HLD (hyperlipidemia)    Hypertension    Insomnia    Osteoarthritis of left hip    Seasonal allergies    Sleep apnea    uses CPAP nightly   Spondylolysis of cervical region    Spondylolysis of lumbar region     No family history on file.  Past Surgical History:  Procedure Laterality Date   CARDIAC CATHETERIZATION  2003   COLONOSCOPY     several x 3   JOINT REPLACEMENT  2015   left knee   PALATE SURGERY  2008   benign   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   Procedure: LEFT TOTAL HIP ARTHROPLASTY ANTERIOR APPROACH;  Surgeon: Tarry Kos, MD;  Location: [facility] OR;  Service: Orthopedics;  Laterality: Left;  3-C   TOTAL HIP ARTHROPLASTY Right 05/03/2022   Procedure: RIGHT TOTAL HIP ARTHROPLASTY ANTERIOR APPROACH;  Surgeon: Tarry Kos, MD;  Location: [facility] OR;  Service: Orthopedics;  Laterality: Right;   UPPER GI ENDOSCOPY     several - stomach ulcers   Social History   Occupational History   Not on file  Tobacco Use   Smoking status: Former    Packs/day: 1.00    Years: 20.00    Total pack years: 20.00    Types: Cigarettes    Quit date: 08/24/1991    Years since quitting: 30.8   Smokeless tobacco: Never  Vaping Use   Vaping Use: Never used  Substance and Sexual Activity   Alcohol use: Not Currently    Comment: wine   Drug use: Never   Sexual activity: Not Currently

## 2022-07-31 ENCOUNTER — Ambulatory Visit: Payer: BC Managed Care – PPO | Admitting: Orthopaedic Surgery

## 2022-08-15 ENCOUNTER — Ambulatory Visit (INDEPENDENT_AMBULATORY_CARE_PROVIDER_SITE_OTHER): Payer: BC Managed Care – PPO

## 2022-08-15 ENCOUNTER — Encounter: Payer: Self-pay | Admitting: Orthopaedic Surgery

## 2022-08-15 ENCOUNTER — Ambulatory Visit (INDEPENDENT_AMBULATORY_CARE_PROVIDER_SITE_OTHER): Payer: BC Managed Care – PPO | Admitting: Orthopaedic Surgery

## 2022-08-15 DIAGNOSIS — Z96641 Presence of right artificial hip joint: Secondary | ICD-10-CM | POA: Diagnosis not present

## 2022-08-15 DIAGNOSIS — Z96642 Presence of left artificial hip joint: Secondary | ICD-10-CM

## 2022-08-15 DIAGNOSIS — Z471 Aftercare following joint replacement surgery: Secondary | ICD-10-CM

## 2022-08-15 DIAGNOSIS — Z96643 Presence of artificial hip joint, bilateral: Secondary | ICD-10-CM | POA: Diagnosis not present

## 2022-08-15 NOTE — Progress Notes (Signed)
Post-Op Visit Note   Patient: Kent Rodriguez           Date of Birth: 07/16/1946           MRN: [id number] Visit Date: 08/15/2022 PCP: Gaspar Skeeters, MD   Assessment & Plan:  Chief Complaint:  Chief Complaint  Patient presents with   Right Hip - Follow-up    Right total hip arthroplasty 05/03/2022   Left Hip - Follow-up    Left total hip arthroplasty 01/22/2022   Visit Diagnoses:  1. History of total replacement of right hip   2. Status post total replacement of left hip     Plan: Patient is following up for sequential total hip replacements.  Underwent left total hip replacement 6 months ago and underwent right total hip replacement 3 months ago.  He is doing well from both surgeries.  He is transferring independently now.  He is mainly limited by his right knee DJD.  Has had some increasing pain in his left hip region due to activity.  Surgical scars are all fully healed.  The implants look stable on x-rays.  He is standing unassisted.  Painless range of motion of the hips.  Overall very happy with how he is recovering from the surgeries.  We will eventually need to look at a knee replacement with without want to do this until sometime early to mid next year.  In regards to the left hip pain I think this is from overactivity tendinitis.  He is in the pool every day doing water exercises for upwards of 2 to 3 hours.  Dental prophylaxis reinforced.  We will recheck him in 6 months with standing AP pelvis x-rays.  Follow-Up Instructions: Return in about 6 months (around 02/13/2023).   Orders:  Orders Placed This Encounter  Procedures   XR HIP UNILAT W OR W/O PELVIS 2-3 VIEWS LEFT   No orders of the defined types were placed in this encounter.   Imaging: XR HIP UNILAT W OR W/O PELVIS 2-3 VIEWS LEFT  Result Date: 08/15/2022 Stable total hip replacement without complication.   PMFS History: Patient Active Problem List   Diagnosis Date Noted   Status post total  replacement of right hip 05/03/2022   Primary osteoarthritis of right hip 03/06/2022   Primary osteoarthritis of left hip 01/22/2022   Status post total replacement of left hip 01/22/2022   High blood cholesterol 11/06/2021   Insomnia 11/06/2021   Diabetes mellitus without complication (HCC) 07/16/2019   Essential hypertension 07/16/2019   Primary osteoarthritis involving multiple joints 07/16/2019   Screening for prostate cancer 07/16/2019   Obstructive sleep apnea (adult) (pediatric) 02/26/2019   Morbid (severe) obesity due to excess calories (HCC) 02/26/2019   Cervical spondylosis 02/02/2019   Lumbar spondylosis 02/02/2019   Depression, unspecified 02/02/2019   Dyspnea on exertion 02/02/2019   Hand pain 02/02/2019   Chest pain with high risk for cardiac etiology 02/02/2019   Hip pain 02/02/2019   Knee pain 02/02/2019   Localized, primary osteoarthritis 02/02/2019   Low back pain 02/02/2019   Neck pain 02/02/2019   Osteoarthritis of knee 02/02/2019   Past Medical History:  Diagnosis Date   Back pain    lower back   COPD (chronic obstructive pulmonary disease) (HCC)    Dependent on wheelchair    Depression    Diabetes mellitus without complication (HCC)    type 2   HLD (hyperlipidemia)    Hypertension    Insomnia    Osteoarthritis  of left hip    Seasonal allergies    Sleep apnea    uses CPAP nightly   Spondylolysis of cervical region    Spondylolysis of lumbar region     No family history on file.  Past Surgical History:  Procedure Laterality Date   CARDIAC CATHETERIZATION  2003   COLONOSCOPY     several x 3   JOINT REPLACEMENT  2015   left knee   PALATE SURGERY  2008   benign   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   Procedure: LEFT TOTAL HIP ARTHROPLASTY ANTERIOR APPROACH;  Surgeon: Tarry Kos, MD;  Location: [facility];  Service: Orthopedics;  Laterality: Left;  3-C   TOTAL HIP ARTHROPLASTY Right 05/03/2022   Procedure: RIGHT TOTAL HIP ARTHROPLASTY ANTERIOR  APPROACH;  Surgeon: Tarry Kos, MD;  Location: [facility];  Service: Orthopedics;  Laterality: Right;   UPPER GI ENDOSCOPY     several - stomach ulcers   Social History   Occupational History   Not on file  Tobacco Use   Smoking status: Former    Packs/day: 1.00    Years: 20.00    Total pack years: 20.00    Types: Cigarettes    Quit date: 08/24/1991    Years since quitting: 30.9   Smokeless tobacco: Never  Vaping Use   Vaping Use: Never used  Substance and Sexual Activity   Alcohol use: Not Currently    Comment: wine   Drug use: Never   Sexual activity: Not Currently

## 2022-09-17 ENCOUNTER — Encounter: Payer: Self-pay | Admitting: Orthopaedic Surgery

## 2022-11-19 ENCOUNTER — Other Ambulatory Visit: Payer: Self-pay | Admitting: Physician Assistant

## 2022-11-26 ENCOUNTER — Other Ambulatory Visit: Payer: Self-pay | Admitting: Physician Assistant

## 2022-12-03 ENCOUNTER — Other Ambulatory Visit: Payer: Self-pay | Admitting: Orthopaedic Surgery

## 2022-12-03 ENCOUNTER — Other Ambulatory Visit: Payer: Self-pay | Admitting: Physician Assistant

## 2022-12-10 ENCOUNTER — Other Ambulatory Visit: Payer: Self-pay | Admitting: Physician Assistant

## 2022-12-17 ENCOUNTER — Other Ambulatory Visit: Payer: Self-pay | Admitting: Physician Assistant

## 2022-12-24 ENCOUNTER — Other Ambulatory Visit: Payer: Self-pay | Admitting: Physician Assistant

## 2023-02-14 ENCOUNTER — Ambulatory Visit: Payer: BC Managed Care – PPO | Admitting: Orthopaedic Surgery

## 2023-02-15 ENCOUNTER — Ambulatory Visit: Payer: BC Managed Care – PPO | Admitting: Orthopaedic Surgery

## 2023-03-12 ENCOUNTER — Ambulatory Visit: Payer: BC Managed Care – PPO | Admitting: Orthopaedic Surgery

## 2023-03-12 ENCOUNTER — Encounter: Payer: Self-pay | Admitting: Orthopaedic Surgery

## 2023-03-12 ENCOUNTER — Other Ambulatory Visit (INDEPENDENT_AMBULATORY_CARE_PROVIDER_SITE_OTHER): Payer: BC Managed Care – PPO

## 2023-03-12 DIAGNOSIS — Z96642 Presence of left artificial hip joint: Secondary | ICD-10-CM

## 2023-03-12 DIAGNOSIS — Z96641 Presence of right artificial hip joint: Secondary | ICD-10-CM | POA: Diagnosis not present

## 2023-03-12 NOTE — Progress Notes (Signed)
Office Visit Note   Patient: Kent Rodriguez           Date of Birth: 30-Mar-1946           MRN: [id number] Visit Date: 03/12/2023              Requested by: Gaspar Skeeters, MD [id number] [facility] [place],  [place] [address] PCP: Gaspar Skeeters, MD   Assessment & Plan: Visit Diagnoses:  1. Status post total replacement of left hip   2. Status post total replacement of right hip     Plan: Impression is 77 year old gentleman status post bilateral sequential total hip replacements.  He has done very well from the surgeries.  He has lack of muscular endurance due to prolonged relative immobility.  I do not recommend doing a knee replacement on him at this time.  Dental prophylaxis reinforced.  Recheck in another year with standing AP pelvis x-rays.  Follow-Up Instructions: Return in about 1 year (around 03/11/2024).   Orders:  Orders Placed This Encounter  Procedures   XR Pelvis 1-2 Views   No orders of the defined types were placed in this encounter.     Procedures: No procedures performed   Clinical Data: No additional findings.   Subjective: Chief Complaint  Patient presents with   Right Hip - Follow-up    Right THA 05/03/2022   Left Hip - Follow-up    Left THA 01/22/2022    HPI  Patient is a very pleasant 77 year old gentleman who is status post right total hip replacement on 05/03/2022 and left total hip replacement on 01/22/2022.  He is doing very well and very happy with the outcome.  He reports no pain in his hips.  He is able to walk short distances with a walker at home.  Uses motorized wheelchair for longer distances.   Review of Systems   Objective: Vital Signs: There were no vitals taken for this visit.  Physical Exam  Ortho Exam  Examination of bilateral hips show fully healed surgical scars.  Fluid painless range of motion.  Specialty Comments:  No specialty comments available.  Imaging: XR Pelvis 1-2 Views  Result Date: 03/12/2023 Stable  bilateral total hip replacements without complications    PMFS History: Patient Active Problem List   Diagnosis Date Noted   Status post total replacement of right hip 05/03/2022   Primary osteoarthritis of right hip 03/06/2022   Primary osteoarthritis of left hip 01/22/2022   Status post total replacement of left hip 01/22/2022   High blood cholesterol 11/06/2021   Insomnia 11/06/2021   Diabetes mellitus without complication 07/16/2019   Essential hypertension 07/16/2019   Primary osteoarthritis involving multiple joints 07/16/2019   Screening for prostate cancer 07/16/2019   Obstructive sleep apnea (adult) (pediatric) 02/26/2019   Morbid (severe) obesity due to excess calories 02/26/2019   Cervical spondylosis 02/02/2019   Lumbar spondylosis 02/02/2019   Depression, unspecified 02/02/2019   Dyspnea on exertion 02/02/2019   Hand pain 02/02/2019   Chest pain with high risk for cardiac etiology 02/02/2019   Hip pain 02/02/2019   Knee pain 02/02/2019   Localized, primary osteoarthritis 02/02/2019   Low back pain 02/02/2019   Neck pain 02/02/2019   Osteoarthritis of knee 02/02/2019   Past Medical History:  Diagnosis Date   Back pain    lower back   COPD (chronic obstructive pulmonary disease)    Dependent on wheelchair    Depression    Diabetes mellitus without complication    type  2   HLD (hyperlipidemia)    Hypertension    Insomnia    Osteoarthritis of left hip    Seasonal allergies    Sleep apnea    uses CPAP nightly   Spondylolysis of cervical region    Spondylolysis of lumbar region     No family history on file.  Past Surgical History:  Procedure Laterality Date   CARDIAC CATHETERIZATION  2003   COLONOSCOPY     several x 3   JOINT REPLACEMENT  2015   left knee   PALATE SURGERY  2008   benign   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   Procedure: LEFT TOTAL HIP ARTHROPLASTY ANTERIOR APPROACH;  Surgeon: Tarry Kos, MD;  Location: [facility];  Service:  Orthopedics;  Laterality: Left;  3-C   TOTAL HIP ARTHROPLASTY Right 05/03/2022   Procedure: RIGHT TOTAL HIP ARTHROPLASTY ANTERIOR APPROACH;  Surgeon: Tarry Kos, MD;  Location: [facility];  Service: Orthopedics;  Laterality: Right;   UPPER GI ENDOSCOPY     several - stomach ulcers   Social History   Occupational History   Not on file  Tobacco Use   Smoking status: Former    Packs/day: 1.00    Years: 20.00    Additional pack years: 0.00    Total pack years: 20.00    Types: Cigarettes    Quit date: 08/24/1991    Years since quitting: 31.5   Smokeless tobacco: Never  Vaping Use   Vaping Use: Never used  Substance and Sexual Activity   Alcohol use: Not Currently    Comment: wine   Drug use: Never   Sexual activity: Not Currently

## 2023-03-13 ENCOUNTER — Other Ambulatory Visit: Payer: Self-pay

## 2023-03-13 ENCOUNTER — Encounter: Payer: Self-pay | Admitting: Orthopaedic Surgery

## 2023-03-13 MED ORDER — AMOXICILLIN 500 MG PO TABS: ORAL_TABLET | ORAL | 0 refills | Status: AC

## 2023-11-02 ENCOUNTER — Other Ambulatory Visit: Payer: Self-pay | Admitting: Orthopaedic Surgery

## 2023-11-09 ENCOUNTER — Other Ambulatory Visit: Payer: Self-pay | Admitting: Physician Assistant

## 2023-11-16 ENCOUNTER — Other Ambulatory Visit: Payer: Self-pay | Admitting: Physician Assistant

## 2024-01-04 HISTORY — PX: UMBILICAL HERNIA REPAIR: SHX196

## 2024-01-24 ENCOUNTER — Encounter: Payer: Self-pay | Admitting: Orthopaedic Surgery

## 2024-03-10 ENCOUNTER — Ambulatory Visit: Payer: BC Managed Care – PPO | Admitting: Orthopaedic Surgery

## 2024-03-30 IMAGING — RF DG HIP (WITH OR WITHOUT PELVIS) 1V*R*
1 series · 8 of 8 positions shown · non-contrast
Comparison: Pelvis radiographs 03/06/2022.

CLINICAL DATA: Right total hip arthroplasty.  Anterior approach.

EXAM:
OPERATIVE right HIP (WITH PELVIS IF PERFORMED) 6 VIEWS
TECHNIQUE: Fluoroscopic spot image(s) were submitted for interpretation
post-operatively.

[Series 1: unknown protocol · 0.20mm/px · 8 of 8 slices shown]
[im 1/8]
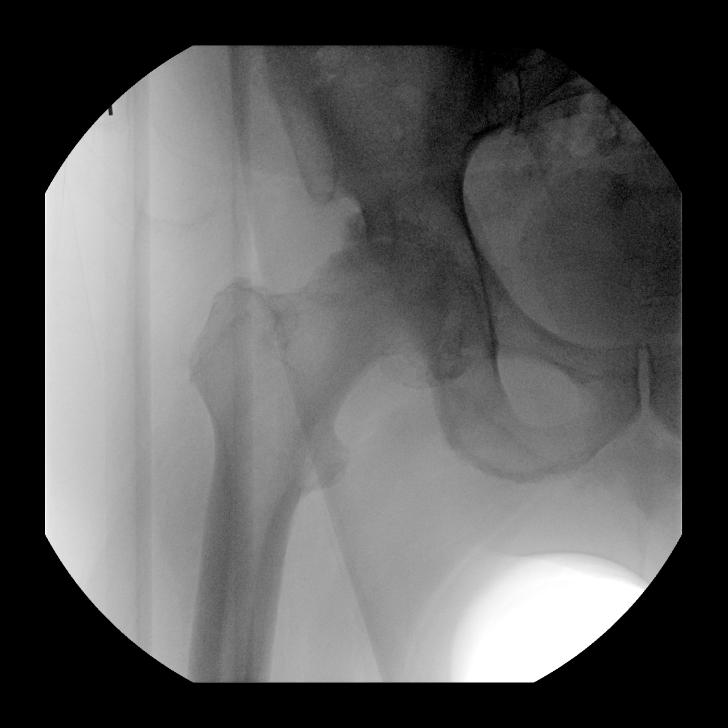
[im 2/8]
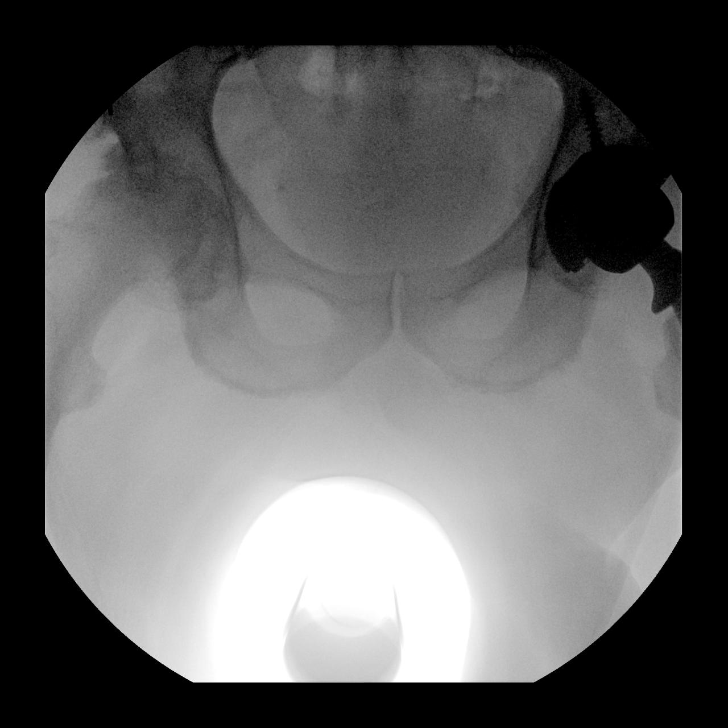
[im 3/8]
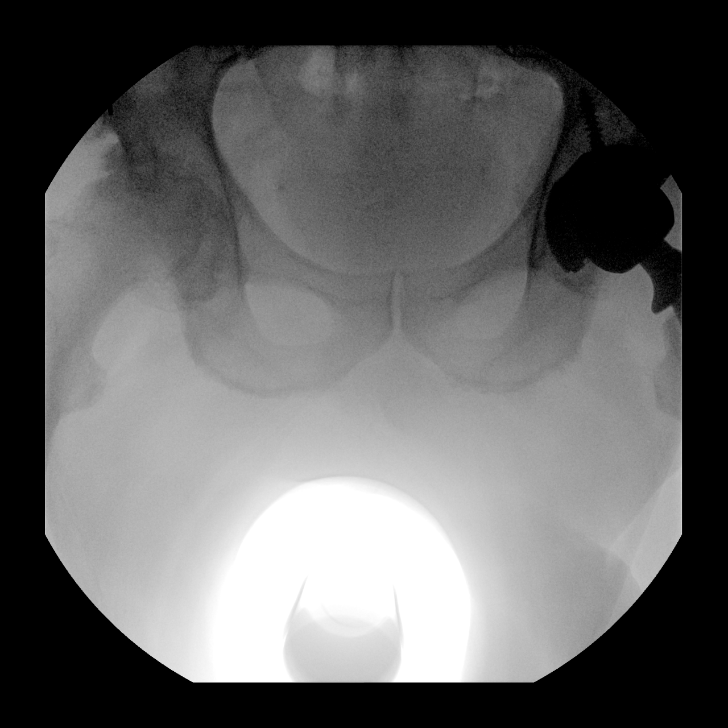
[im 4/8]
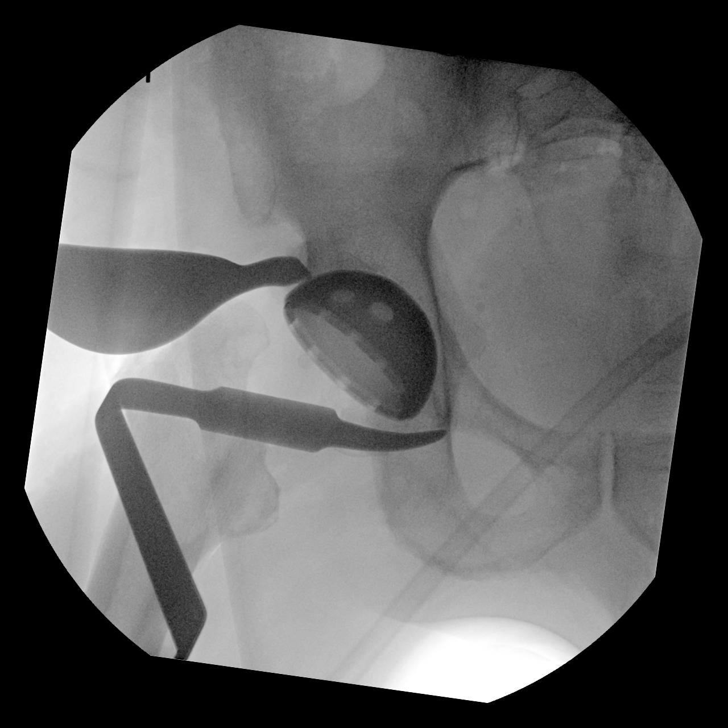
[im 5/8]
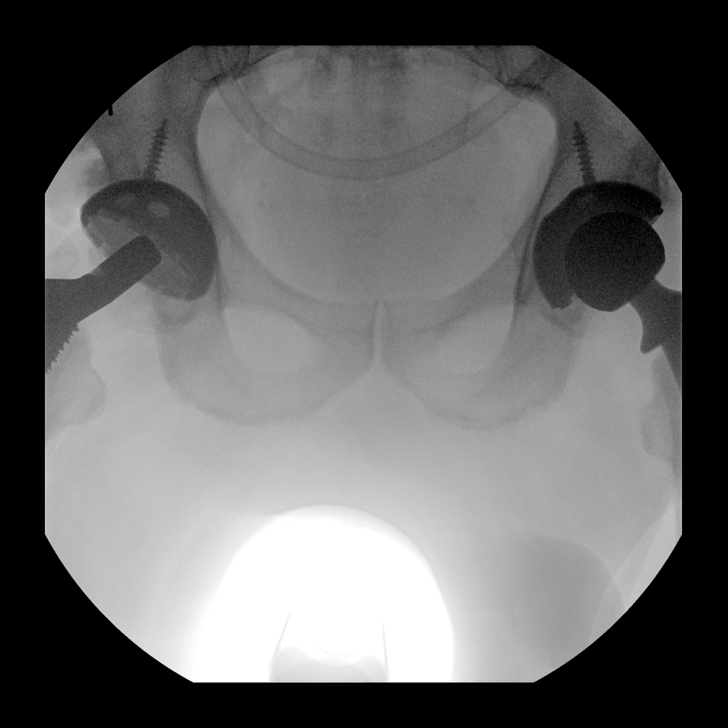
[im 6/8]
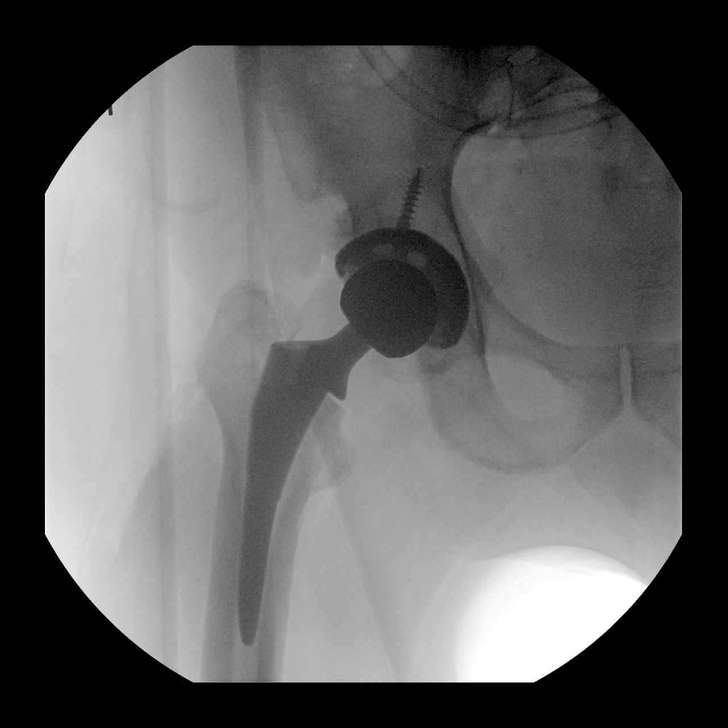
[im 7/8]
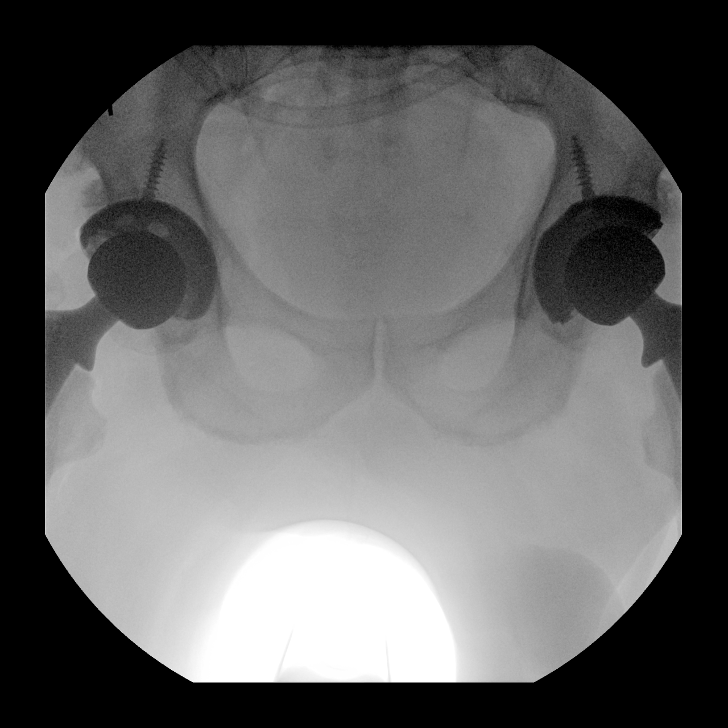
[im 8/8]
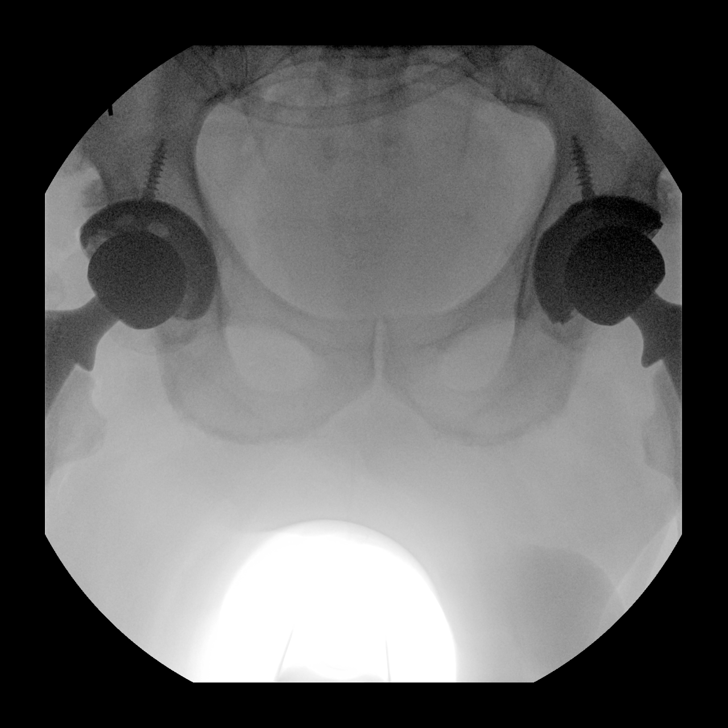

[8 of 8 positions shown; findings below may reference images not displayed]

FINDINGS: Advanced degenerative changes are again noted in the native hip.
Intraoperative images demonstrate placement of a right total hip
arthroplasty. Hip a peers located on this single view. No new
fractures are present.
IMPRESSION: Right total hip arthroplasty without radiographic evidence for
complication.

## 2024-04-07 ENCOUNTER — Ambulatory Visit: Admitting: Orthopaedic Surgery

## 2024-04-07 ENCOUNTER — Other Ambulatory Visit (INDEPENDENT_AMBULATORY_CARE_PROVIDER_SITE_OTHER): Payer: Self-pay

## 2024-04-07 VITALS — Ht 66.0 in | Wt 248.0 lb

## 2024-04-07 DIAGNOSIS — M1711 Unilateral primary osteoarthritis, right knee: Secondary | ICD-10-CM

## 2024-04-07 DIAGNOSIS — Z96643 Presence of artificial hip joint, bilateral: Secondary | ICD-10-CM | POA: Diagnosis not present

## 2024-04-07 NOTE — Progress Notes (Signed)
 Office Visit Note   Patient: Kent Rodriguez           Date of Birth: 1945/12/11           MRN: [id number] Visit Date: 04/07/2024              Requested by: Derrill Flirt, MD [address] [facility] [address] [place],  [place] [address] PCP: Derrill Flirt, MD   Assessment & Plan: Visit Diagnoses:  1. Primary osteoarthritis of right knee   2. H/O bilateral hip replacements     Plan: History of Present Illness Kent Rodriguez is a 78 year old male with a history of joint replacements who presents with right knee pain and mobility issues.  He has significantly improved his mobility over the past year, moving from a motorized wheelchair to walking without assistance, though he occasionally uses a cane for stability. This improvement is due to regular exercise, including hydrotherapy, and a weight loss of 65 pounds over the past two years.  He underwent sequential bilateral total hip arthroplasties in 2023.    He experiences pain and grinding in his right knee, which occasionally collapses, necessitating the use of a cane on uneven surfaces. Cortisone injections were previously used for knee pain but were discontinued due to ineffectiveness and pain. There is no tenderness along the joint line.  He has undergone joint replacements, including two hips and a left knee, and seeks to further improve his walking ability. An electric wheelchair is used for longer distances and outdoor activities due to distance and terrain challenges.  Physical Exam MUSCULOSKELETAL: Knee lacks 5 degrees of extension. Knee flexion 95-100 degrees. No tenderness along joint line. Collateral ligaments stable.  Results RADIOLOGY Knee X-ray: Severe osteoarthritis with varus deformity  Assessment and Plan Osteoarthritis of knee Chronic knee osteoarthritis with bone-on-bone arthritis and varus deformity. Conservative management failed. Total knee replacement recommended based on findings. Recovery expected to be  challenging. - Obtain surgical clearance from Dr. Rocky Cipro. - Schedule total knee replacement post-clearance. - Plan overnight hospital stay post-surgery.  Impression is severe right knee degenerative joint disease secondary to Osteoarthritis.  Bone on bone joint space narrowing is seen on radiographs with significant varus alignment.  At this point, conservative treatments fail to provide any significant relief and the pain is severely affecting ADLs and quality of life.  Based on treatment options, the patient has elected to move forward with a knee replacement.  We have discussed the surgical risks that include but are not limited to infection, DVT, leg length discrepancy, stiffness, numbness, tingling, incomplete relief of pain.  Recovery and prognosis were also reviewed.    Anticoagulants: No antithrombotic Postop anticoagulation: Eliquis Diabetic: No  Nickel allergy: No Prior DVT/PE: No Tobacco use: No Clearances needed for surgery: Merris Stambaugh Anticipated discharge dispo: Home   Follow-Up Instructions: No follow-ups on file.   Orders:  Orders Placed This Encounter  Procedures   XR HIPS BILAT W OR W/O PELVIS 2V   XR KNEE 3 VIEW RIGHT   No orders of the defined types were placed in this encounter.   Subjective: Chief Complaint  Patient presents with   Left Hip - Follow-up   Right Hip - Follow-up      Review of Systems  Constitutional: Negative.   HENT: Negative.    Eyes: Negative.   Respiratory: Negative.    Cardiovascular: Negative.   Gastrointestinal: Negative.   Endocrine: Negative.   Genitourinary: Negative.   Skin: Negative.   Allergic/Immunologic: Negative.   Neurological:  Negative.   Hematological: Negative.   Psychiatric/Behavioral: Negative.    All other systems reviewed and are negative.    Objective: Vital Signs: Ht 5\' 6"  (1.676 m)   Wt 248 lb (112.5 kg)   BMI 40.03 kg/m   Physical Exam Vitals and nursing note reviewed.   Constitutional:      Appearance: He is well-developed.  HENT:     Head: Normocephalic and atraumatic.  Eyes:     Pupils: Pupils are equal, round, and reactive to light.  Pulmonary:     Effort: Pulmonary effort is normal.  Abdominal:     Palpations: Abdomen is soft.  Musculoskeletal:        General: Normal range of motion.     Cervical back: Neck supple.  Skin:    General: Skin is warm.  Neurological:     Mental Status: He is alert and oriented to person, place, and time.  Psychiatric:        Behavior: Behavior normal.        Thought Content: Thought content normal.        Judgment: Judgment normal.     Ortho Exam  Specialty Comments:  No specialty comments available.  Imaging: XR HIPS BILAT W OR W/O PELVIS 2V Result Date: 04/07/2024 Xrays show bilateral total hip arthroplasties without complications.  XR KNEE 3 VIEW RIGHT Result Date: 04/07/2024 X-rays demonstrate severe osteoarthritis.  Bone-on-bone joint space narrowing with varus deformity.    PMFS History: Patient Active Problem List   Diagnosis Date Noted   Status post total replacement of right hip 05/03/2022   Primary osteoarthritis of right hip 03/06/2022   Primary osteoarthritis of left hip 01/22/2022   Status post total replacement of left hip 01/22/2022   High blood cholesterol 11/06/2021   Insomnia 11/06/2021   Diabetes mellitus without complication (HCC) 07/16/2019   Essential hypertension 07/16/2019   Primary osteoarthritis involving multiple joints 07/16/2019   Screening for prostate cancer 07/16/2019   Obstructive sleep apnea (adult) (pediatric) 02/26/2019   Morbid (severe) obesity due to excess calories (HCC) 02/26/2019   Cervical spondylosis 02/02/2019   Lumbar spondylosis 02/02/2019   Depression, unspecified 02/02/2019   Dyspnea on exertion 02/02/2019   Hand pain 02/02/2019   Chest pain with high risk for cardiac etiology 02/02/2019   Hip pain 02/02/2019   Knee pain 02/02/2019    Localized, primary osteoarthritis 02/02/2019   Low back pain 02/02/2019   Neck pain 02/02/2019   Osteoarthritis of knee 02/02/2019   Past Medical History:  Diagnosis Date   Back pain    lower back   COPD (chronic obstructive pulmonary disease) (HCC)    Dependent on wheelchair    Depression    Diabetes mellitus without complication (HCC)    type 2   HLD (hyperlipidemia)    Hypertension    Insomnia    Osteoarthritis of left hip    Seasonal allergies    Sleep apnea    uses CPAP nightly   Spondylolysis of cervical region    Spondylolysis of lumbar region     No family history on file.  Past Surgical History:  Procedure Laterality Date   CARDIAC CATHETERIZATION  2003   COLONOSCOPY     several x 3   JOINT REPLACEMENT  2015   left knee   PALATE SURGERY  2008   benign   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   Procedure: LEFT TOTAL HIP ARTHROPLASTY ANTERIOR APPROACH;  Surgeon: Wes Hamman, MD;  Location: [facility];  Service: Orthopedics;  Laterality: Left;  3-C   TOTAL HIP ARTHROPLASTY Right 05/03/2022   Procedure: RIGHT TOTAL HIP ARTHROPLASTY ANTERIOR APPROACH;  Surgeon: Wes Hamman, MD;  Location: [facility];  Service: Orthopedics;  Laterality: Right;   UPPER GI ENDOSCOPY     several - stomach ulcers   Social History   Occupational History   Not on file  Tobacco Use   Smoking status: Former    Current packs/day: 0.00    Average packs/day: 1 pack/day for 20.0 years (20.0 ttl pk-yrs)    Types: Cigarettes    Start date: 08/24/1971    Quit date: 08/24/1991    Years since quitting: 32.6   Smokeless tobacco: Never  Vaping Use   Vaping status: Never Used  Substance and Sexual Activity   Alcohol use: Not Currently    Comment: wine   Drug use: Never   Sexual activity: Not Currently

## 2024-06-29 ENCOUNTER — Other Ambulatory Visit: Payer: Self-pay | Admitting: Physician Assistant

## 2024-06-29 MED ORDER — DOCUSATE SODIUM 100 MG PO CAPS
100.0000 mg | ORAL_CAPSULE | Freq: Every day | ORAL | 2 refills | Status: AC | PRN
Start: 2024-06-29 — End: 2025-06-29

## 2024-06-29 MED ORDER — DOXYCYCLINE HYCLATE 100 MG PO TABS: 100.0000 mg | ORAL_TABLET | Freq: Two times a day (BID) | ORAL | 0 refills | Status: AC

## 2024-06-29 MED ORDER — ONDANSETRON HCL 4 MG PO TABS: 4.0000 mg | ORAL_TABLET | Freq: Three times a day (TID) | ORAL | 0 refills | Status: AC | PRN

## 2024-06-29 MED ORDER — METHOCARBAMOL 500 MG PO TABS: 500.0000 mg | ORAL_TABLET | Freq: Two times a day (BID) | ORAL | 2 refills | Status: DC | PRN

## 2024-06-29 MED ORDER — HYDROCODONE-ACETAMINOPHEN 7.5-325 MG PO TABS: 1.0000 | ORAL_TABLET | Freq: Four times a day (QID) | ORAL | 0 refills | Status: DC | PRN

## 2024-07-06 NOTE — Pre-Procedure Instructions (Signed)
 Surgical Instructions   Your procedure is scheduled on July 13, 2024. Report to [facility] Main Entrance A at 8:45 A.M., then check in with the Admitting office. Any questions or running late day of surgery: call [phone]  Questions prior to your surgery date: call [phone], Monday-Friday, 8am-4pm. If you experience any cold or flu symptoms such as cough, fever, chills, shortness of breath, etc. between now and your scheduled surgery, please notify us  at the above number.     Remember:  Do not eat after midnight the night before your surgery  You may drink clear liquids until 8:15 AM the morning of your surgery.   Clear liquids allowed are: Water, Non-Citrus Juices (without pulp), Carbonated Beverages, Clear Tea (no milk, honey, etc.), Black Coffee Only (NO MILK, CREAM OR POWDERED CREAMER of any kind), and Gatorade.  Patient Instructions  The night before surgery:  No food after midnight. ONLY clear liquids after midnight  The day of surgery (if you have diabetes): Drink ONE (1) 12 oz G2 given to you in your pre admission testing appointment by 8:15 AM the morning of surgery. Drink in one sitting. Do not sip.  This drink was given to you during your hospital  pre-op appointment visit.  Nothing else to drink after completing the  12 oz bottle of G2.         If you have questions, please contact your surgeon's office.   Take these medicines the morning of surgery with A SIP OF WATER: morphine  (MS CONTIN )  solifenacin (VESICARE)    May take these medicines IF NEEDED: fluticasone (FLONASE) 50 MCG/ACT nasal spray  morphine  (MSIR)  naloxone  (NARCAN ) nasal spray    One week prior to surgery, STOP taking any Aspirin  (unless otherwise instructed by your surgeon) Aleve, Naproxen, Ibuprofen, Motrin, Advil, Goody's, BC's, all herbal medications, fish oil, and non-prescription vitamins. This includes your medication: meloxicam  (MOBIC )    WHAT DO I DO ABOUT MY DIABETES  MEDICATION?   Do not take metFORMIN  (GLUCOPHAGE -XR) the morning of surgery.   HOW TO MANAGE YOUR DIABETES BEFORE AND AFTER SURGERY  Why is it important to control my blood sugar before and after surgery? Improving blood sugar levels before and after surgery helps healing and can limit problems. A way of improving blood sugar control is eating a healthy diet by:  Eating less sugar and carbohydrates  Increasing activity/exercise  Talking with your doctor about reaching your blood sugar goals High blood sugars (greater than 180 mg/dL) can raise your risk of infections and slow your recovery, so you will need to focus on controlling your diabetes during the weeks before surgery. Make sure that the doctor who takes care of your diabetes knows about your planned surgery including the date and location.  How do I manage my blood sugar before surgery? Check your blood sugar at least 4 times a day, starting 2 days before surgery, to make sure that the level is not too high or low.  Check your blood sugar the morning of your surgery when you wake up and every 2 hours until you get to the Short Stay unit.  If your blood sugar is less than 70 mg/dL, you will need to treat for low blood sugar: Do not take insulin . Treat a low blood sugar (less than 70 mg/dL) with  cup of clear juice (cranberry or apple), 4 glucose tablets, OR glucose gel. Recheck blood sugar in 15 minutes after treatment (to make sure it is greater than 70  mg/dL). If your blood sugar is not greater than 70 mg/dL on recheck, call [phone] for further instructions. Report your blood sugar to the short stay nurse when you get to Short Stay.  If you are admitted to the hospital after surgery: Your blood sugar will be checked by the staff and you will probably be given insulin  after surgery (instead of oral diabetes medicines) to make sure you have good blood sugar levels. The goal for blood sugar control after surgery is 80-180  mg/dL.                     Do NOT Smoke (Tobacco/Vaping) for 24 hours prior to your procedure.  If you use a CPAP at night, you may bring your mask/headgear for your overnight stay.   You will be asked to remove any contacts, glasses, piercing's, hearing aid's, dentures/partials prior to surgery. Please bring cases for these items if needed.    Patients discharged the day of surgery will not be allowed to drive home, and someone needs to stay with them for 24 hours.  SURGICAL WAITING ROOM VISITATION Patients may have no more than 2 support people in the waiting area - these visitors may rotate.   Pre-op nurse will coordinate an appropriate time for 1 ADULT support person, who may not rotate, to accompany patient in pre-op.  Children under the age of 60 must have an adult with them who is not the patient and must remain in the main waiting area with an adult.  If the patient needs to stay at the hospital during part of their recovery, the visitor guidelines for inpatient rooms apply.  Please refer to the [facility] website for the visitor guidelines for any additional information.   If you received a COVID test during your pre-op visit  it is requested that you wear a mask when out in public, stay away from anyone that may not be feeling well and notify your surgeon if you develop symptoms. If you have been in contact with anyone that has tested positive in the last 10 days please notify you surgeon.      Pre-operative 5 CHG Bathing Instructions   You can play a key role in reducing the risk of infection after surgery. Your skin needs to be as free of germs as possible. You can reduce the number of germs on your skin by washing with CHG (chlorhexidine  gluconate) soap before surgery. CHG is an antiseptic soap that kills germs and continues to kill germs even after washing.   DO NOT use if you have an allergy to chlorhexidine /CHG or antibacterial soaps. If your skin becomes reddened or  irritated, stop using the CHG and notify one of our RNs at [phone].   Please shower with the CHG soap starting 4 days before surgery using the following schedule:     Please keep in mind the following:  DO NOT shave, including legs and underarms, starting the day of your first shower.   You may shave your face at any point before/day of surgery.  Place clean sheets on your bed the day you start using CHG soap. Use a clean washcloth (not used since being washed) for each shower. DO NOT sleep with pets once you start using the CHG.   CHG Shower Instructions:  Wash your face and private area with normal soap. If you choose to wash your hair, wash first with your normal shampoo.  After you use shampoo/soap, rinse your hair and  body thoroughly to remove shampoo/soap residue.  Turn the water OFF and apply about 3 tablespoons (45 ml) of CHG soap to a CLEAN washcloth.  Apply CHG soap ONLY FROM YOUR NECK DOWN TO YOUR TOES (washing for 3-5 minutes)  DO NOT use CHG soap on face, private areas, open wounds, or sores.  Pay special attention to the area where your surgery is being performed.  If you are having back surgery, having someone wash your back for you may be helpful. Wait 2 minutes after CHG soap is applied, then you may rinse off the CHG soap.  Pat dry with a clean towel  Put on clean clothes/pajamas   If you choose to wear lotion, please use ONLY the CHG-compatible lotions that are listed below.  Additional instructions for the day of surgery: DO NOT APPLY any lotions, deodorants, cologne, or perfumes.   Do not bring valuables to the hospital. [facility] is not responsible for any belongings/valuables. Do not wear nail polish, gel polish, artificial nails, or any other type of covering on natural nails (fingers and toes) Do not wear jewelry or makeup Put on clean/comfortable clothes.  Please brush your teeth.  Ask your nurse before applying any prescription medications to the  skin.     CHG Compatible Lotions   Aveeno Moisturizing lotion  Cetaphil Moisturizing Cream  Cetaphil Moisturizing Lotion  Clairol Herbal Essence Moisturizing Lotion, Dry Skin  Clairol Herbal Essence Moisturizing Lotion, Extra Dry Skin  Clairol Herbal Essence Moisturizing Lotion, Normal Skin  Curel Age Defying Therapeutic Moisturizing Lotion with Alpha Hydroxy  Curel Extreme Care Body Lotion  Curel Soothing Hands Moisturizing Hand Lotion  Curel Therapeutic Moisturizing Cream, Fragrance-Free  Curel Therapeutic Moisturizing Lotion, Fragrance-Free  Curel Therapeutic Moisturizing Lotion, Original Formula  Eucerin Daily Replenishing Lotion  Eucerin Dry Skin Therapy Plus Alpha Hydroxy Crme  Eucerin Dry Skin Therapy Plus Alpha Hydroxy Lotion  Eucerin Original Crme  Eucerin Original Lotion  Eucerin Plus Crme Eucerin Plus Lotion  Eucerin TriLipid Replenishing Lotion  Keri Anti-Bacterial Hand Lotion  Keri Deep Conditioning Original Lotion Dry Skin Formula Softly Scented  Keri Deep Conditioning Original Lotion, Fragrance Free Sensitive Skin Formula  Keri Lotion Fast Absorbing Fragrance Free Sensitive Skin Formula  Keri Lotion Fast Absorbing Softly Scented Dry Skin Formula  Keri Original Lotion  Keri Skin Renewal Lotion Keri Silky Smooth Lotion  Keri Silky Smooth Sensitive Skin Lotion  Nivea Body Creamy Conditioning Oil  Nivea Body Extra Enriched Lotion  Nivea Body Original Lotion  Nivea Body Sheer Moisturizing Lotion Nivea Crme  Nivea Skin Firming Lotion  NutraDerm 30 Skin Lotion  NutraDerm Skin Lotion  NutraDerm Therapeutic Skin Cream  NutraDerm Therapeutic Skin Lotion  ProShield Protective Hand Cream  Provon moisturizing lotion  Please read over the following fact sheets that you were given.

## 2024-07-07 ENCOUNTER — Other Ambulatory Visit: Payer: Self-pay

## 2024-07-07 ENCOUNTER — Encounter (HOSPITAL_COMMUNITY)
Admission: RE | Admit: 2024-07-07 | Discharge: 2024-07-07 | Disposition: A | Discharge status: Home / Self Care | Source: Ambulatory Visit | Attending: Orthopaedic Surgery | Admitting: Orthopaedic Surgery

## 2024-07-07 ENCOUNTER — Encounter (HOSPITAL_COMMUNITY): Payer: Self-pay

## 2024-07-07 VITALS — BP 132/73 | HR 53 | Temp 98.3°F | Resp 18 | Ht 66.0 in | Wt 236.0 lb

## 2024-07-07 DIAGNOSIS — Z01812 Encounter for preprocedural laboratory examination: Secondary | ICD-10-CM | POA: Insufficient documentation

## 2024-07-07 DIAGNOSIS — Z01818 Encounter for other preprocedural examination: Secondary | ICD-10-CM

## 2024-07-07 DIAGNOSIS — E119 Type 2 diabetes mellitus without complications: Secondary | ICD-10-CM | POA: Diagnosis not present

## 2024-07-07 LAB — SURGICAL PCR SCREEN
MRSA, PCR: NEGATIVE
Staphylococcus aureus: NEGATIVE

## 2024-07-07 LAB — CBC
HCT: 48.5 % (ref 39.0–52.0)
Hemoglobin: 15.7 g/dL (ref 13.0–17.0)
MCH: 30.4 pg (ref 26.0–34.0)
MCHC: 32.4 g/dL (ref 30.0–36.0)
MCV: 94 fL (ref 80.0–100.0)
Platelets: 221 K/uL (ref 150–400)
RBC: 5.16 MIL/uL (ref 4.22–5.81)
RDW: 13.7 % (ref 11.5–15.5)
WBC: 7.1 K/uL (ref 4.0–10.5)
nRBC: 0 % (ref 0.0–0.2)

## 2024-07-07 LAB — GLUCOSE, CAPILLARY: Glucose-Capillary: 103 mg/dL — ABNORMAL HIGH (ref 70–99)

## 2024-07-07 LAB — BASIC METABOLIC PANEL WITH GFR
Anion gap: 10 (ref 5–15)
BUN: 22 mg/dL (ref 8–23)
CO2: 23 mmol/L (ref 22–32)
Calcium: 9.4 mg/dL (ref 8.9–10.3)
Chloride: 105 mmol/L (ref 98–111)
Creatinine, Ser: 0.94 mg/dL (ref 0.61–1.24)
GFR, Estimated: >60 mL/min (ref >60)
Glucose, Bld: 97 mg/dL (ref 70–99)
Potassium: 3.6 mmol/L (ref 3.5–5.1)
Sodium: 138 mmol/L (ref 135–145)

## 2024-07-07 NOTE — Progress Notes (Addendum)
 PCP - Dr. Genetta Robe Cardiologist - denies  PPM/ICD - denies   Chest x-ray - 02/26/19 EKG - 01/16/24- [facility]- tracing requested Stress Test - 15-20 yrs per pt, in [place], normal per pt ECHO - 02/19/19- [facility] Cardiac Cath - 2003  Sleep Study - OSA+ CPAP - nightly, pressure setting 11  Fasting Blood Sugar - 90-110 Checks Blood Sugar once a day  Last dose of GLP1 agonist-  n/a   ASA/Blood Thinner Instructions: n/a   ERAS Protcol - clears until 0730   COVID TEST- n/a   Anesthesia review: yes, EKG requested  Patient denies shortness of breath, fever, cough and chest pain at PAT appointment   All instructions explained to the patient, with a verbal understanding of the material. Patient agrees to go over the instructions while at home for a better understanding. The opportunity to ask questions was provided.

## 2024-07-08 LAB — HEMOGLOBIN A1C
Hgb A1c MFr Bld: 6.3 % — ABNORMAL HIGH (ref 4.8–5.6)
Mean Plasma Glucose: 134 mg/dL

## 2024-07-10 MED ORDER — TRANEXAMIC ACID 1000 MG/10ML IV SOLN
2000.0000 mg | INTRAVENOUS | Status: DC
  Filled 2024-07-10: qty 20

## 2024-07-12 NOTE — Anesthesia Preprocedure Evaluation (Addendum)
 Anesthesia Evaluation  Patient identified by MRN, date of birth, ID band Patient awake    Reviewed: Allergy & Precautions, NPO status , Patient's Chart, lab work & pertinent test results, reviewed documented beta blocker date and time   Airway Mallampati: II  TM Distance: >3 FB Neck ROM: Limited    Dental  (+) Dental Advisory Given, Teeth Intact   Pulmonary sleep apnea and Continuous Positive Airway Pressure Ventilation , COPD, former smoker   Pulmonary exam normal breath sounds clear to auscultation       Cardiovascular hypertension, Pt. on medications and Pt. on home beta blockers Normal cardiovascular exam Rhythm:Regular Rate:Normal  ECG: NSR, rate 60   Neuro/Psych  PSYCHIATRIC DISORDERS  Depression    negative neurological ROS     GI/Hepatic negative GI ROS,,,(+)     substance abuse    Endo/Other  diabetes, Well Controlled, Type 2, Oral Hypoglycemic Agents    Renal/GU negative Renal ROS     Musculoskeletal  (+) Arthritis , Osteoarthritis,  narcotic dependentWheelchair bound Right hip DJD   Abdominal  (+) + obese  Peds  Hematology negative hematology ROS (+)   Anesthesia Other Findings   Reproductive/Obstetrics                              Anesthesia Physical Anesthesia Plan  ASA: 3  Anesthesia Plan: Spinal   Post-op Pain Management: Regional block* and Tylenol  PO (pre-op)*   Induction: Intravenous  PONV Risk Score and Plan: 2 and Ondansetron , Dexamethasone , Midazolam , Propofol  infusion and Treatment may vary due to age or medical condition  Airway Management Planned: Simple Face Mask and Natural Airway  Additional Equipment: None  Intra-op Plan:   Post-operative Plan:   Informed Consent: I have reviewed the patients History and Physical, chart, labs and discussed the procedure including the risks, benefits and alternatives for the proposed anesthesia with the patient or  authorized representative who has indicated his/her understanding and acceptance.     Dental advisory given  Plan Discussed with: CRNA  Anesthesia Plan Comments: (Reviewed PAT note written 01/19/2022 by Allison Zelenak, PA-C. )         Anesthesia Quick Evaluation

## 2024-07-13 ENCOUNTER — Ambulatory Visit (HOSPITAL_COMMUNITY): Payer: Self-pay | Admitting: Physician Assistant

## 2024-07-13 ENCOUNTER — Other Ambulatory Visit: Payer: Self-pay

## 2024-07-13 ENCOUNTER — Encounter (HOSPITAL_COMMUNITY)
Admission: RE | Disposition: A | Discharge status: Home / Self Care | Payer: Self-pay | Source: Home / Self Care | Attending: Orthopaedic Surgery

## 2024-07-13 ENCOUNTER — Other Ambulatory Visit (HOSPITAL_COMMUNITY): Payer: Self-pay

## 2024-07-13 ENCOUNTER — Observation Stay (HOSPITAL_COMMUNITY)

## 2024-07-13 ENCOUNTER — Observation Stay (HOSPITAL_COMMUNITY)
Admission: RE | Admit: 2024-07-13 | Discharge: 2024-07-14 | Disposition: A | Discharge status: Home / Self Care | Attending: Orthopaedic Surgery | Admitting: Orthopaedic Surgery

## 2024-07-13 ENCOUNTER — Ambulatory Visit (HOSPITAL_COMMUNITY): Payer: Self-pay | Admitting: Anesthesiology

## 2024-07-13 ENCOUNTER — Encounter (HOSPITAL_COMMUNITY): Payer: Self-pay | Admitting: Orthopaedic Surgery

## 2024-07-13 ENCOUNTER — Other Ambulatory Visit: Payer: Self-pay | Admitting: Physician Assistant

## 2024-07-13 DIAGNOSIS — Z96651 Presence of right artificial knee joint: Secondary | ICD-10-CM

## 2024-07-13 DIAGNOSIS — E119 Type 2 diabetes mellitus without complications: Secondary | ICD-10-CM | POA: Diagnosis not present

## 2024-07-13 DIAGNOSIS — Z96652 Presence of left artificial knee joint: Secondary | ICD-10-CM | POA: Diagnosis not present

## 2024-07-13 DIAGNOSIS — I10 Essential (primary) hypertension: Secondary | ICD-10-CM | POA: Diagnosis not present

## 2024-07-13 DIAGNOSIS — Z87891 Personal history of nicotine dependence: Secondary | ICD-10-CM | POA: Insufficient documentation

## 2024-07-13 DIAGNOSIS — M1711 Unilateral primary osteoarthritis, right knee: Principal | ICD-10-CM | POA: Diagnosis present

## 2024-07-13 HISTORY — PX: TOTAL KNEE ARTHROPLASTY: SHX125

## 2024-07-13 LAB — GLUCOSE, CAPILLARY
Glucose-Capillary: 107 mg/dL — ABNORMAL HIGH (ref 70–99)
Glucose-Capillary: 113 mg/dL — ABNORMAL HIGH (ref 70–99)
Glucose-Capillary: 137 mg/dL — ABNORMAL HIGH (ref 70–99)
Glucose-Capillary: 252 mg/dL — ABNORMAL HIGH (ref 70–99)
Glucose-Capillary: 270 mg/dL — ABNORMAL HIGH (ref 70–99)

## 2024-07-13 SURGERY — ARTHROPLASTY, KNEE, TOTAL
Anesthesia: Spinal | Site: Knee | Laterality: Right

## 2024-07-13 MED ORDER — DEXAMETHASONE SODIUM PHOSPHATE 4 MG/ML IJ SOLN
INTRAMUSCULAR | Status: DC | PRN
Start: 2024-07-13 — End: 2024-07-13
  Administered 2024-07-13 (×2): 4 mg via PERINEURAL

## 2024-07-13 MED ORDER — INSULIN ASPART 100 UNIT/ML IJ SOLN
0.0000 [IU] | Freq: Every day | INTRAMUSCULAR | Status: DC
  Administered 2024-07-13 (×2): 3 [IU] via SUBCUTANEOUS

## 2024-07-13 MED ORDER — ONDANSETRON HCL 4 MG/2ML IJ SOLN
INTRAMUSCULAR | Status: DC | PRN
  Administered 2024-07-13 (×2): 4 mg via INTRAVENOUS

## 2024-07-13 MED ORDER — METHOCARBAMOL 500 MG PO TABS: 500.0000 mg | ORAL_TABLET | Freq: Four times a day (QID) | ORAL | Status: DC | PRN

## 2024-07-13 MED ORDER — CLONIDINE HCL (ANALGESIA) 100 MCG/ML EP SOLN
EPIDURAL | Status: DC | PRN
  Administered 2024-07-13 (×2): 60 ug

## 2024-07-13 MED ORDER — METOCLOPRAMIDE HCL 5 MG/ML IJ SOLN: 5.0000 mg | Freq: Three times a day (TID) | INTRAMUSCULAR | Status: DC | PRN

## 2024-07-13 MED ORDER — TRANEXAMIC ACID-NACL 1000-0.7 MG/100ML-% IV SOLN: 1000.0000 mg | Freq: Once | INTRAVENOUS | Status: DC

## 2024-07-13 MED ORDER — APIXABAN 2.5 MG PO TABS
2.5000 mg | ORAL_TABLET | Freq: Two times a day (BID) | ORAL | Status: DC
  Administered 2024-07-14 (×2): 2.5 mg via ORAL
  Filled 2024-07-13: qty 1

## 2024-07-13 MED ORDER — HYDROMORPHONE HCL 1 MG/ML IJ SOLN
INTRAMUSCULAR | Status: AC
  Filled 2024-07-13: qty 1

## 2024-07-13 MED ORDER — CEFAZOLIN SODIUM-DEXTROSE 2-4 GM/100ML-% IV SOLN
INTRAVENOUS | Status: AC
  Filled 2024-07-13: qty 100

## 2024-07-13 MED ORDER — CHLORHEXIDINE GLUCONATE 0.12 % MT SOLN
15.0000 mL | Freq: Once | OROMUCOSAL | Status: AC
  Administered 2024-07-13 (×2): 15 mL via OROMUCOSAL
  Filled 2024-07-13: qty 15

## 2024-07-13 MED ORDER — FESOTERODINE FUMARATE ER 4 MG PO TB24
4.0000 mg | ORAL_TABLET | Freq: Every day | ORAL | Status: DC
  Administered 2024-07-14 (×2): 4 mg via ORAL
  Filled 2024-07-13: qty 1

## 2024-07-13 MED ORDER — PHENYLEPHRINE HCL-NACL 20-0.9 MG/250ML-% IV SOLN
INTRAVENOUS | Status: AC
  Filled 2024-07-13: qty 500

## 2024-07-13 MED ORDER — BUPIVACAINE-MELOXICAM ER 400-12 MG/14ML IJ SOLN
INTRAMUSCULAR | Status: AC
  Filled 2024-07-13: qty 1

## 2024-07-13 MED ORDER — SODIUM CHLORIDE 0.9 % IV SOLN: INTRAVENOUS | Status: DC

## 2024-07-13 MED ORDER — HYDROMORPHONE HCL 1 MG/ML IJ SOLN
0.5000 mg | INTRAMUSCULAR | Status: DC | PRN
  Filled 2024-07-13: qty 1

## 2024-07-13 MED ORDER — LISINOPRIL 10 MG PO TABS
10.0000 mg | ORAL_TABLET | Freq: Every day | ORAL | Status: DC
  Administered 2024-07-14 (×2): 10 mg via ORAL
  Filled 2024-07-13: qty 1

## 2024-07-13 MED ORDER — BUPIVACAINE-MELOXICAM ER 400-12 MG/14ML IJ SOLN
INTRAMUSCULAR | Status: DC | PRN
  Administered 2024-07-13 (×2): 400 mg

## 2024-07-13 MED ORDER — PHENOL 1.4 % MT LIQD: 1.0000 | OROMUCOSAL | Status: DC | PRN

## 2024-07-13 MED ORDER — HYDROMORPHONE HCL 1 MG/ML IJ SOLN
0.2500 mg | INTRAMUSCULAR | Status: DC | PRN
  Administered 2024-07-13 (×8): 0.5 mg via INTRAVENOUS

## 2024-07-13 MED ORDER — DEXAMETHASONE SODIUM PHOSPHATE 10 MG/ML IJ SOLN
INTRAMUSCULAR | Status: DC | PRN
  Administered 2024-07-13 (×2): 5 mg via INTRAVENOUS

## 2024-07-13 MED ORDER — APIXABAN 2.5 MG PO TABS
ORAL_TABLET | ORAL | 0 refills | Status: AC
Start: 2024-07-13 — End: ?
  Filled 2024-07-13: qty 60, 30d supply, fill #0

## 2024-07-13 MED ORDER — SODIUM CHLORIDE 0.9 % IR SOLN
Status: DC | PRN
  Administered 2024-07-13 (×2): 1000 mL

## 2024-07-13 MED ORDER — HYDROCODONE-ACETAMINOPHEN 5-325 MG PO TABS
1.0000 | ORAL_TABLET | ORAL | Status: DC | PRN
  Administered 2024-07-13 – 2024-07-14 (×8): 2 via ORAL
  Filled 2024-07-13 (×4): qty 2

## 2024-07-13 MED ORDER — HYDROCHLOROTHIAZIDE 12.5 MG PO TABS
12.5000 mg | ORAL_TABLET | Freq: Every day | ORAL | Status: DC
  Administered 2024-07-14 (×2): 12.5 mg via ORAL
  Filled 2024-07-13: qty 1

## 2024-07-13 MED ORDER — MIDAZOLAM HCL 2 MG/2ML IJ SOLN
INTRAMUSCULAR | Status: AC
  Filled 2024-07-13: qty 2

## 2024-07-13 MED ORDER — FENTANYL CITRATE (PF) 100 MCG/2ML IJ SOLN: 50.0000 ug | Freq: Once | INTRAMUSCULAR | Status: AC

## 2024-07-13 MED ORDER — CEFAZOLIN SODIUM-DEXTROSE 2-4 GM/100ML-% IV SOLN
2.0000 g | Freq: Four times a day (QID) | INTRAVENOUS | Status: AC
  Administered 2024-07-14 (×4): 2 g via INTRAVENOUS
  Filled 2024-07-13 (×2): qty 100

## 2024-07-13 MED ORDER — CEFAZOLIN SODIUM-DEXTROSE 2-4 GM/100ML-% IV SOLN
2.0000 g | INTRAVENOUS | Status: AC
  Administered 2024-07-13 (×2): 2 g via INTRAVENOUS

## 2024-07-13 MED ORDER — TRANEXAMIC ACID-NACL 1000-0.7 MG/100ML-% IV SOLN
1000.0000 mg | INTRAVENOUS | Status: AC
  Administered 2024-07-13 (×2): 1000 mg via INTRAVENOUS

## 2024-07-13 MED ORDER — DOCUSATE SODIUM 100 MG PO CAPS
100.0000 mg | ORAL_CAPSULE | Freq: Two times a day (BID) | ORAL | Status: DC
  Administered 2024-07-14 (×2): 100 mg via ORAL
  Filled 2024-07-13 (×2): qty 1

## 2024-07-13 MED ORDER — ORAL CARE MOUTH RINSE: 15.0000 mL | Freq: Once | OROMUCOSAL | Status: AC

## 2024-07-13 MED ORDER — LACTATED RINGERS IV SOLN: INTRAVENOUS | Status: DC

## 2024-07-13 MED ORDER — ROPIVACAINE HCL 5 MG/ML IJ SOLN
INTRAMUSCULAR | Status: DC | PRN
  Administered 2024-07-13 (×2): 30 mL via PERINEURAL

## 2024-07-13 MED ORDER — TRANEXAMIC ACID 1000 MG/10ML IV SOLN
INTRAVENOUS | Status: DC | PRN
  Administered 2024-07-13 (×2): 2000 mg via TOPICAL

## 2024-07-13 MED ORDER — DEXAMETHASONE SODIUM PHOSPHATE 10 MG/ML IJ SOLN
10.0000 mg | Freq: Once | INTRAMUSCULAR | Status: AC
  Administered 2024-07-14 (×2): 10 mg via INTRAVENOUS
  Filled 2024-07-13: qty 1

## 2024-07-13 MED ORDER — 0.9 % SODIUM CHLORIDE (POUR BTL) OPTIME
TOPICAL | Status: DC | PRN
  Administered 2024-07-13 (×2): 1000 mL

## 2024-07-13 MED ORDER — NALOXONE HCL 4 MG/0.1ML NA LIQD: 1.0000 | NASAL | Status: DC | PRN

## 2024-07-13 MED ORDER — HYDROMORPHONE HCL 1 MG/ML IJ SOLN
0.2500 mg | INTRAMUSCULAR | Status: DC | PRN
  Administered 2024-07-13 (×4): 0.5 mg via INTRAVENOUS

## 2024-07-13 MED ORDER — ONDANSETRON HCL 4 MG/2ML IJ SOLN
4.0000 mg | Freq: Four times a day (QID) | INTRAMUSCULAR | Status: DC | PRN
  Administered 2024-07-14 (×2): 4 mg via INTRAVENOUS
  Filled 2024-07-13 (×2): qty 2

## 2024-07-13 MED ORDER — EPHEDRINE SULFATE-NACL 50-0.9 MG/10ML-% IV SOSY
PREFILLED_SYRINGE | INTRAVENOUS | Status: DC | PRN
  Administered 2024-07-13 (×2): 5 mg via INTRAVENOUS

## 2024-07-13 MED ORDER — FENTANYL CITRATE (PF) 100 MCG/2ML IJ SOLN
INTRAMUSCULAR | Status: AC
  Administered 2024-07-13 (×2): 50 ug via INTRAVENOUS
  Filled 2024-07-13: qty 2

## 2024-07-13 MED ORDER — DROPERIDOL 2.5 MG/ML IJ SOLN: 0.6250 mg | Freq: Once | INTRAMUSCULAR | Status: DC | PRN

## 2024-07-13 MED ORDER — ONDANSETRON HCL 4 MG PO TABS: 4.0000 mg | ORAL_TABLET | Freq: Four times a day (QID) | ORAL | Status: DC | PRN

## 2024-07-13 MED ORDER — ACETAMINOPHEN 325 MG PO TABS: 325.0000 mg | ORAL_TABLET | Freq: Four times a day (QID) | ORAL | Status: DC | PRN

## 2024-07-13 MED ORDER — ACETAMINOPHEN 500 MG PO TABS
1000.0000 mg | ORAL_TABLET | Freq: Four times a day (QID) | ORAL | Status: DC
  Administered 2024-07-14 (×2): 1000 mg via ORAL
  Filled 2024-07-13 (×3): qty 2

## 2024-07-13 MED ORDER — ACETAMINOPHEN 500 MG PO TABS
1000.0000 mg | ORAL_TABLET | Freq: Once | ORAL | Status: AC
  Administered 2024-07-13 (×2): 1000 mg via ORAL
  Filled 2024-07-13: qty 2

## 2024-07-13 MED ORDER — PROPOFOL 1000 MG/100ML IV EMUL
INTRAVENOUS | Status: AC
  Filled 2024-07-13: qty 300

## 2024-07-13 MED ORDER — INSULIN ASPART 100 UNIT/ML IJ SOLN
0.0000 [IU] | Freq: Three times a day (TID) | INTRAMUSCULAR | Status: DC
  Administered 2024-07-13 (×2): 11 [IU] via SUBCUTANEOUS
  Administered 2024-07-14 (×4): 4 [IU] via SUBCUTANEOUS

## 2024-07-13 MED ORDER — VANCOMYCIN HCL 1000 MG IV SOLR
INTRAVENOUS | Status: AC
Start: 2024-07-13 — End: 2024-07-13
  Filled 2024-07-13: qty 20

## 2024-07-13 MED ORDER — PROPOFOL 10 MG/ML IV BOLUS
INTRAVENOUS | Status: DC | PRN
  Administered 2024-07-13 (×2): 100 ug/kg/min via INTRAVENOUS

## 2024-07-13 MED ORDER — DEXAMETHASONE SODIUM PHOSPHATE 4 MG/ML IJ SOLN: INTRAMUSCULAR | Status: DC | PRN

## 2024-07-13 MED ORDER — LISINOPRIL-HYDROCHLOROTHIAZIDE 10-12.5 MG PO TABS: 1.0000 | ORAL_TABLET | Freq: Every morning | ORAL | Status: DC

## 2024-07-13 MED ORDER — METOCLOPRAMIDE HCL 5 MG PO TABS: 5.0000 mg | ORAL_TABLET | Freq: Three times a day (TID) | ORAL | Status: DC | PRN

## 2024-07-13 MED ORDER — PRONTOSAN WOUND IRRIGATION OPTIME
TOPICAL | Status: DC | PRN
  Administered 2024-07-13 (×2): 1

## 2024-07-13 MED ORDER — METHOCARBAMOL 1000 MG/10ML IJ SOLN: 500.0000 mg | Freq: Four times a day (QID) | INTRAMUSCULAR | Status: DC | PRN

## 2024-07-13 MED ORDER — VANCOMYCIN HCL 1000 MG IV SOLR
INTRAVENOUS | Status: DC | PRN
  Administered 2024-07-13 (×2): 1000 mg

## 2024-07-13 MED ORDER — PHENYLEPHRINE HCL-NACL 20-0.9 MG/250ML-% IV SOLN
INTRAVENOUS | Status: DC | PRN
  Administered 2024-07-13 (×2): 20 ug/min via INTRAVENOUS

## 2024-07-13 MED ORDER — DOXYCYCLINE HYCLATE 100 MG PO TABS
100.0000 mg | ORAL_TABLET | Freq: Two times a day (BID) | ORAL | Status: DC
  Administered 2024-07-13 – 2024-07-14 (×4): 100 mg via ORAL
  Filled 2024-07-13 (×2): qty 1

## 2024-07-13 MED ORDER — POVIDONE-IODINE 10 % EX SWAB: 2.0000 | Freq: Once | CUTANEOUS | Status: DC

## 2024-07-13 MED ORDER — MENTHOL 3 MG MT LOZG: 1.0000 | LOZENGE | OROMUCOSAL | Status: DC | PRN

## 2024-07-13 MED ORDER — CELECOXIB 200 MG PO CAPS
200.0000 mg | ORAL_CAPSULE | Freq: Two times a day (BID) | ORAL | Status: DC
  Administered 2024-07-14 (×4): 200 mg via ORAL
  Filled 2024-07-13 (×3): qty 1

## 2024-07-13 MED ORDER — MORPHINE SULFATE ER 30 MG PO TBCR
30.0000 mg | EXTENDED_RELEASE_TABLET | Freq: Every day | ORAL | Status: DC
  Administered 2024-07-14 (×2): 30 mg via ORAL
  Filled 2024-07-13: qty 1

## 2024-07-13 SURGICAL SUPPLY — 67 items
ALCOHOL 70% 16 OZ (MISCELLANEOUS) ×1 IMPLANT
BAG COUNTER SPONGE SURGICOUNT (BAG) IMPLANT
BAG DECANTER FOR FLEXI CONT (MISCELLANEOUS) ×1 IMPLANT
BANDAGE ESMARK 6X9 LF (GAUZE/BANDAGES/DRESSINGS) IMPLANT
BLADE SAG 18X100X1.27 (BLADE) ×1 IMPLANT
BLADE SAW SGTL 73X25 THK (BLADE) ×1 IMPLANT
BOWL SMART MIX CTS (DISPOSABLE) ×1 IMPLANT
CEMENT BONE REFOBACIN R1X40 US (Cement) IMPLANT
CLSR STERI-STRIP ANTIMIC 1/2X4 (GAUZE/BANDAGES/DRESSINGS) IMPLANT
COMPONENT TIB CMT PS KN F 0DRT (Joint) IMPLANT
COOLER ICEMAN CLASSIC (MISCELLANEOUS) ×1 IMPLANT
COVER SURGICAL LIGHT HANDLE (MISCELLANEOUS) ×1 IMPLANT
CUFF TOURN SGL QUICK 42 (TOURNIQUET CUFF) IMPLANT
CUFF TRNQT CYL 34X4.125X (TOURNIQUET CUFF) ×1 IMPLANT
DERMABOND ADVANCED .7 DNX12 (GAUZE/BANDAGES/DRESSINGS) ×1 IMPLANT
DRAPE EXTREMITY T 121X128X90 (DISPOSABLE) ×1 IMPLANT
DRAPE HALF SHEET 40X57 (DRAPES) ×1 IMPLANT
DRAPE INCISE IOBAN 66X45 STRL (DRAPES) ×1 IMPLANT
DRAPE POUCH INSTRU U-SHP 10X18 (DRAPES) ×1 IMPLANT
DRAPE SURG ORHT 6 SPLT 77X108 (DRAPES) IMPLANT
DRAPE U-SHAPE 47X51 STRL (DRAPES) ×2 IMPLANT
DRSG AQUACEL AG ADV 3.5X10 (GAUZE/BANDAGES/DRESSINGS) ×1 IMPLANT
DURAPREP 26ML APPLICATOR (WOUND CARE) ×3 IMPLANT
ELECT CAUTERY BLADE 6.4 (BLADE) ×1 IMPLANT
ELECT PENCIL ROCKER SW 15FT (MISCELLANEOUS) ×1 IMPLANT
ELECTRODE REM PT RTRN 9FT ADLT (ELECTROSURGICAL) ×1 IMPLANT
FEMUR CMTD CR STD SZ 8 RT KNEE (Joint) IMPLANT
GLOVE BIOGEL PI IND STRL 7.0 (GLOVE) ×2 IMPLANT
GLOVE BIOGEL PI IND STRL 7.5 (GLOVE) ×5 IMPLANT
GLOVE ECLIPSE 7.0 STRL STRAW (GLOVE) ×3 IMPLANT
GLOVE SURG SYN 7.5 PF PI (GLOVE) ×2 IMPLANT
GLOVE SURG UNDER LTX SZ7.5 (GLOVE) ×2 IMPLANT
GOWN STRL REUS W/ TWL LRG LVL3 (GOWN DISPOSABLE) ×1 IMPLANT
GOWN STRL SURGICAL XL XLNG (GOWN DISPOSABLE) IMPLANT
GOWN TOGA ZIPPER T7+ PEEL AWAY (MISCELLANEOUS) ×1 IMPLANT
HOOD PEEL AWAY T7 (MISCELLANEOUS) ×1 IMPLANT
INSERT TIBIA KNEE RIGHT 10 (Joint) IMPLANT
KIT BASIN OR (CUSTOM PROCEDURE TRAY) ×1 IMPLANT
KIT TURNOVER KIT B (KITS) ×1 IMPLANT
MANIFOLD NEPTUNE II (INSTRUMENTS) ×1 IMPLANT
MARKER SKIN DUAL TIP RULER LAB (MISCELLANEOUS) ×2 IMPLANT
NDL SPNL 18GX3.5 QUINCKE PK (NEEDLE) ×1 IMPLANT
NEEDLE SPNL 18GX3.5 QUINCKE PK (NEEDLE) ×1 IMPLANT
NS IRRIG 1000ML POUR BTL (IV SOLUTION) ×1 IMPLANT
PACK TOTAL JOINT (CUSTOM PROCEDURE TRAY) ×1 IMPLANT
PAD ARMBOARD POSITIONER FOAM (MISCELLANEOUS) ×2 IMPLANT
PAD COLD SHLDR WRAP-ON (PAD) ×1 IMPLANT
PIN DRILL HDLS TROCAR 75 4PK (PIN) IMPLANT
SCREW FEMALE HEX FIX 25X2.5 (ORTHOPEDIC DISPOSABLE SUPPLIES) IMPLANT
SET HNDPC FAN SPRY TIP SCT (DISPOSABLE) ×1 IMPLANT
SOLUTION PRONTOSAN WOUND 350ML (IRRIGATION / IRRIGATOR) ×1 IMPLANT
STAPLER SKIN PROX 35W (STAPLE) IMPLANT
STEM POLY PAT PLY 35M KNEE (Knees) IMPLANT
SUCTION TUBE FRAZIER 10FR DISP (SUCTIONS) IMPLANT
SUT ETHILON 2 0 FS 18 (SUTURE) IMPLANT
SUT MNCRL AB 3-0 PS2 27 (SUTURE) IMPLANT
SUT STRATAFIX PDS+ 0 24IN (SUTURE) IMPLANT
SUT VIC AB 0 CT1 27XBRD ANBCTR (SUTURE) ×2 IMPLANT
SUT VIC AB 1 CTX 27 (SUTURE) ×3 IMPLANT
SUT VIC AB 2-0 CT1 TAPERPNT 27 (SUTURE) ×4 IMPLANT
SYR 50ML LL SCALE MARK (SYRINGE) ×2 IMPLANT
TOWEL GREEN STERILE (TOWEL DISPOSABLE) ×1 IMPLANT
TOWEL GREEN STERILE FF (TOWEL DISPOSABLE) ×1 IMPLANT
TRAY CATH INTERMITTENT SS 16FR (CATHETERS) IMPLANT
TUBE SUCT ARGYLE STRL (TUBING) ×1 IMPLANT
UNDERPAD 30X36 HEAVY ABSORB (UNDERPADS AND DIAPERS) ×1 IMPLANT
YANKAUER SUCT BULB TIP NO VENT (SUCTIONS) ×1 IMPLANT

## 2024-07-13 NOTE — Progress Notes (Incomplete)
 Pt arrived from PACU in bed with ice man connected to right knee and ted hose. Pt has spouse at bedside. Pt has been oriented to the room.

## 2024-07-13 NOTE — Transfer of Care (Signed)
 Immediate Anesthesia Transfer of Care Note  Patient: Kent Rodriguez  Procedure(s) Performed: ARTHROPLASTY, KNEE, TOTAL (Right: Knee)  Patient Location: PACU  Anesthesia Type:MAC and Spinal  Level of Consciousness: drowsy  Airway & Oxygen Therapy: Patient Spontanous Breathing  Post-op Assessment: Report given to RN  Post vital signs: Reviewed and stable  Last Vitals:  Vitals Value Taken Time  BP 108/56 07/13/24 12:45  Temp    Pulse 71 07/13/24 12:45  Resp 11 07/13/24 12:45  SpO2 95 % 07/13/24 12:45  Vitals shown include unfiled device data.  Last Pain:  Vitals:   07/13/24 0832  TempSrc:   PainSc: 7          Complications: No notable events documented.

## 2024-07-13 NOTE — Anesthesia Procedure Notes (Signed)
 Anesthesia Regional Block: Adductor canal block   Pre-Anesthetic Checklist: , timeout performed,  Correct Patient, Correct Site, Correct Laterality,  Correct Procedure, Correct Position, site marked,  Risks and benefits discussed,  Surgical consent,  Pre-op evaluation,  At surgeon's request and post-op pain management  Laterality: Lower and Right  Prep: chloraprep       Needles:  Injection technique: Single-shot  Needle Type: Stimiplex     Needle Length: 9cm  Needle Gauge: 21     Additional Needles:   Procedures:,,,, ultrasound used (permanent image in chart),,    Narrative:  Start time: 07/13/2024 9:25 AM End time: 07/13/2024 9:45 AM Injection made incrementally with aspirations every 5 mL.  Performed by: Personally  Anesthesiologist: Darlyn Rush, MD  Additional Notes: BP cuff, EKG monitors applied. Sedation begun. Artery and nerve location verified with ultrasound. Anesthetic injected incrementally (5ml), slowly, and after negative aspirations under direct u/s guidance. Good fascial/perineural spread. Tolerated well.

## 2024-07-13 NOTE — Progress Notes (Signed)
 Pt admitted to 6N08 from the PACU, iceman on R knee.  Placed pt in the bone foam zero knee and he was not able to tolerate it very well.  Called [place], [place] and asked if CPM was supposed to be ordered as it was not. She said to hold off on CPM for now. Told her that he was not able to tolerate the bone foam. She stated even if he could tolerate it for 15 minutes at the time, that would be helpful.

## 2024-07-13 NOTE — Evaluation (Signed)
 Physical Therapy Evaluation Patient Details Name: Kent Rodriguez MRN: [id number] DOB: 1946/08/17 Today's Date: 07/13/2024  History of Present Illness  Pt is 78 year old presented to [facility] on  07/13/24 for rt TKR. PMH - COPD, DM, depression, HLD, HTN, OA, back pain, Lt TKA in 2005, Rt THR 05/2022, Lt THR 01/2022.  Clinical Impression  Pt doing well POD #0 with mobility and very motivated to maximize independence. Pt lives in independent home in retirement community with husband. Plans to return home with husband. Do not anticipate problems with that plan.         If plan is discharge home, recommend the following: Assistance with cooking/housework;Assist for transportation;A little help with bathing/dressing/bathroom   Can travel by private vehicle        Equipment Recommendations None recommended by PT  Recommendations for Other Services       Functional Status Assessment Patient has had a recent decline in their functional status and demonstrates the ability to make significant improvements in function in a reasonable and predictable amount of time.     Precautions / Restrictions Precautions Precautions: Knee Restrictions Weight Bearing Restrictions Per Provider Order: Yes RLE Weight Bearing Per Provider Order: Weight bearing as tolerated      Mobility  Bed Mobility Overal bed mobility: Needs Assistance Bed Mobility: Supine to Sit     Supine to sit: Min assist, HOB elevated     General bed mobility comments: Assist to elevate trunk into sittin    Transfers Overall transfer level: Needs assistance Equipment used: Rolling walker (2 wheels) Transfers: Sit to/from Stand Sit to Stand: Contact guard assist           General transfer comment: For safety, Verbal cues for hands    Ambulation/Gait Ambulation/Gait assistance: Contact guard assist, Supervision Gait Distance (Feet): 80 Feet Assistive device: Rolling walker (2 wheels) Gait Pattern/deviations:  Step-through pattern, Decreased stance time - right Gait velocity: decr Gait velocity interpretation: <1.31 ft/sec, indicative of household ambulator   General Gait Details: CGA progressing to supervision for safety  Stairs            Wheelchair Mobility     Tilt Bed    Modified Rankin (Stroke Patients Only)       Balance Overall balance assessment: Mild deficits observed, not formally tested                                           Pertinent Vitals/Pain Pain Assessment Pain Assessment: 0-10 Pain Score: 6  Pain Location: rt knee Pain Descriptors / Indicators: Aching, Guarding Pain Intervention(s): Limited activity within patient's tolerance, Repositioned, Monitored during session, Premedicated before session    Home Living Family/patient expects to be discharged to:: Private residence Living Arrangements: Spouse/significant other Available Help at Discharge: Family;Available 24 hours/day Type of Home: Independent living facility Home Access: Level entry       Home Layout: One level Home Equipment: Agricultural consultant (2 wheels);Rollator (4 wheels);Wheelchair - manual;Wheelchair - power;Grab bars - toilet;Grab bars - tub/shower;Shower seat Additional Comments: husband works from home    Prior Function Prior Level of Function : Needs assist       Physical Assist : ADLs (physical)   ADLs (physical): Dressing;IADLs Mobility Comments: No assistive device ambulating in home. Occasional uses rolling walker. Uses power w/c as transportation around retirement communtiy, ADLs Comments: Spouse assist with lower body dressing. Can  use dressing stick for underwear     Extremity/Trunk Assessment   Upper Extremity Assessment Upper Extremity Assessment: Defer to OT evaluation    Lower Extremity Assessment Lower Extremity Assessment: RLE deficits/detail RLE Deficits / Details: Good quad set. AAROM 5-80 degrees       Communication    Communication Communication: No apparent difficulties    Cognition Arousal: Alert Behavior During Therapy: WFL for tasks assessed/performed   PT - Cognitive impairments: No apparent impairments                         Following commands: Intact       Cueing Cueing Techniques: Verbal cues     General Comments      Exercises Total Joint Exercises Quad Sets: AROM, Right, 5 reps, Supine Long Arc Quad: AROM, Right, 5 reps, Seated Knee Flexion: AAROM, Right, 5 reps, Seated Goniometric ROM: 5-80   Assessment/Plan    PT Assessment Patient needs continued PT services  PT Problem List Decreased strength;Decreased range of motion;Decreased mobility;Pain       PT Treatment Interventions DME instruction;Gait training;Functional mobility training;Therapeutic activities;Therapeutic exercise;Patient/family education    PT Goals (Current goals can be found in the Care Plan section)  Acute Rehab PT Goals Patient Stated Goal: return home PT Goal Formulation: With patient Time For Goal Achievement: 07/17/24 Potential to Achieve Goals: Good    Frequency 7X/week     Co-evaluation               AM-PAC PT 6 Clicks Mobility  Outcome Measure Help needed turning from your back to your side while in a flat bed without using bedrails?: A Little Help needed moving from lying on your back to sitting on the side of a flat bed without using bedrails?: A Little Help needed moving to and from a bed to a chair (including a wheelchair)?: A Little Help needed standing up from a chair using your arms (e.g., wheelchair or bedside chair)?: A Little Help needed to walk in hospital room?: A Little Help needed climbing 3-5 steps with a railing? : A Little 6 Click Score: 18    End of Session Equipment Utilized During Treatment: Gait belt Activity Tolerance: Patient tolerated treatment well Patient left: in chair;with call bell/phone within reach Nurse Communication: Mobility  status PT Visit Diagnosis: Other abnormalities of gait and mobility (R26.89);Difficulty in walking, not elsewhere classified (R26.2);Pain Pain - Right/Left: Right Pain - part of body: Knee    Time: 1719-1755 PT Time Calculation (min) (ACUTE ONLY): 36 min   Charges:   PT Evaluation $PT Eval Moderate Complexity: 1 Mod PT Treatments $Gait Training: 8-22 mins PT General Charges $$ ACUTE PT VISIT: 1 Visit         [facility] PT Acute Rehabilitation Services Office [phone]   Rodgers ORN [facility] 07/13/2024, 6:13 PM

## 2024-07-13 NOTE — Discharge Instructions (Addendum)

## 2024-07-13 NOTE — Progress Notes (Signed)
 Orthopedic Tech Progress Note Patient Details:  Kent Rodriguez 1946-08-12 [id number]  Ortho Devices Type of Ortho Device: Bone foam zero knee Ortho Device/Splint Location: RLE Ortho Device/Splint Interventions: Ordered, Other (comment)   Post Interventions Patient Tolerated: Well Instructions Provided: Care of device  [name] Pac 07/13/2024, 1:21 PM

## 2024-07-13 NOTE — Op Note (Signed)
 Total Knee Arthroplasty Procedure Note  Preoperative diagnosis: Right knee osteoarthritis  Postoperative diagnosis:same  Operative findings: Complete loss articular cartilage from all 3 compartments Flexion contracture  Operative procedure: Right total knee arthroplasty. CPT [id number]  Surgeon: N. Ozell Cummins, MD  Assist: Ronal [name], PA-C; necessary for the timely completion of procedure and due to complexity of procedure.  Anesthesia: Spinal, regional, local  Tourniquet time: see anesthesia record  Implants used: Zimmer persona Femur: CR 8 Tibia: F Patella: 35 mm Polyethylene: 10 mm medial congruent  Indication: Kent Rodriguez is a 78 y.o. year old male with a history of knee pain. Having failed conservative management, the patient elected to proceed with a total knee arthroplasty.  We have reviewed the risk and benefits of the surgery and they elected to proceed after voicing understanding.  Procedure:  After informed consent was obtained and understanding of the risk were voiced including but not limited to bleeding, infection, damage to surrounding structures including nerves and vessels, blood clots, leg length inequality and the failure to achieve desired results, the operative extremity was marked with verbal confirmation of the patient in the holding area.   The patient was then brought to the operating room and transported to the operating room table in the supine position.  A tourniquet was applied to the operative extremity around the upper thigh. The operative limb was then prepped and draped in the usual sterile fashion and preoperative antibiotics were administered.  A time out was performed prior to the start of surgery confirming the correct extremity, preoperative antibiotic administration, as well as team members, implants and instruments available for the case. Correct surgical site was also confirmed with preoperative radiographs. The limb was then  elevated for exsanguination and the tourniquet was inflated. A midline incision was made and a standard medial parapatellar approach was performed.  The infrapatellar fat pad was removed.  Suprapatellar synovium was removed to reveal the anterior distal femoral cortex.  A medial peel was performed to release the capsule and the deep MCL off of the medial tibial plateau back to the semimembranosus.  The patella was then everted which showed complete loss of articular cartilage and was resected down to 14 mm and sized to a 35 mm.  A cover was placed on the patella for protection from retractors.  The knee was then brought into flexion and we then turned our attention to the femur.  The ACL was sacrificed.  Start site was drilled in the femur and the intramedullary distal femoral cutting guide was placed, set at 5 degrees valgus, taking 12 mm of distal resection for the flexion contracture. The distal cut was made. Osteophytes were then removed.  Next, the proximal tibial cutting guide was placed with appropriate slope, varus/valgus alignment and depth of resection.  The drop rod was attached to confirm that it was aimed at the second metatarsal.  The proximal tibial cut was made taking 4 mm off the lower medial side. Gap blocks were then used to assess the extension gap and alignment, and appropriate soft tissue releases were performed. Attention was turned back to the femur, which was sized using the sizing guide to a size 8. Appropriate rotation of the femoral component was determined using epicondylar axis, Whiteside's line, and assessing the flexion gap under ligament tension. The appropriate size 4-in-1 cutting block was placed and checked with an angel wing and cuts were made. Posterior femoral osteophytes and uncapped bone were then removed with the curved osteotome.  The menisci were removed.  Trial components were placed, and stability was checked in full extension, mid-flexion, and deep flexion.  PCL was  resected to balance the flexion space. Proper tibial rotation was determined and marked.  The patella tracked well without a lateral release.  The femoral lugs were then drilled. Trial components were then removed and tibial preparation performed.  The trial tibia was pointed to the medial third of the tibial tubercle.  The tibia was sized for a size F component and prepared.  Trial components were removed.    The bony surfaces were irrigated with a pulse lavage and then dried. Bone cement was vacuum mixed on the back table, and the final components sized above were cemented into place.  Antibiotic irrigation was placed in the knee joint and soft tissues while the cement cured.  After cement had finished curing, excess cement was removed. The stability of the construct was re-evaluated throughout a range of motion and found to be acceptable. The trial liner was removed, the knee was copiously irrigated, and the knee was re-evaluated for any excess bone debris. The real polyethylene liner, 10 mm thick, was inserted and checked to ensure the locking mechanism had engaged appropriately. The tourniquet was deflated and hemostasis was achieved. The wound was irrigated with normal saline.  One gram of vancomycin  powder was placed in the surgical bed.  Topical mixture of 0.25% bupivacaine  and meloxicam  was placed in the joint for postoperative pain.  Capsular closure was performed with a #1 stratafix in flexion, subcutaneous fat closed with a 0 vicryl suture, then subcutaneous tissue closed with interrupted 2.0 vicryl suture. The skin was then closed with a 2.0 nylon and dermabond. A sterile dressing was applied.  The patient was awakened in the operating room and taken to recovery in stable condition. All sponge, needle, and instrument counts were correct at the end of the case.  [name] was necessary for opening, closing, retracting, limb positioning and overall facilitation and completion of the  surgery.  Position: supine  Complications: none.  Time Out: performed   Drains/Packing: none Estimated blood loss: minimal Returned to Recovery Room: in good condition.   Mechanical VTE (DVT) Prophylaxis: sequential compression devices, TED thigh-high  Chemical VTE (DVT) Prophylaxis: eliquis  POD 1  Fluid Replacement  Crystalloid: see anesthesia record Blood: none  FFP: none   Specimens Removed: 1 to pathology  Sponge and Instrument Count Correct? yes  PACU: portable radiograph - knee AP and Lateral  Plan/RTC: Return in 2 weeks for suture removal.  Weight Bearing/Load Lower Extremity: full   Implant Name Type Inv. Item Serial No. Manufacturer Lot No. LRB No. Used Action  CEMENT BONE REFOBACIN [id number] US  - [id number] Cement CEMENT BONE REFOBACIN [id number] US   ZIMMER RECON(ORTH,TRAU,BIO,SG) [id number] Right 2 Implanted  STEM POLY PAT PLY 67M KNEE - [id number] Knees STEM POLY PAT PLY 67M KNEE  ZIMMER RECON(ORTH,TRAU,BIO,SG) [id number] Right 1 Implanted  COMPONENT TIB CMT PS KN F 0DRT - [id number] Joint COMPONENT TIB CMT PS KN F 0DRT  ZIMMER RECON(ORTH,TRAU,BIO,SG) [id number] Right 1 Implanted  FEMUR CMTD CR STD SZ 8 RT KNEE - [id number] Joint FEMUR CMTD CR STD SZ 8 RT KNEE  ZIMMER RECON(ORTH,TRAU,BIO,SG) [id number] Right 1 Implanted  INSERT TIBIA KNEE RIGHT 10 - [id number] Joint INSERT TIBIA KNEE RIGHT 10  ZIMMER RECON(ORTH,TRAU,BIO,SG) [id number] Right 1 Implanted    N. Ozell Cummins, MD [facility] 1:59 PM

## 2024-07-13 NOTE — H&P (Signed)
 PREOPERATIVE H&P  Chief Complaint: right knee osteoarthritis  HPI: Kent Rodriguez is a 78 y.o. male who presents for surgical treatment of right knee osteoarthritis.  He denies any changes in medical history.  Past Surgical History:  Procedure Laterality Date   BLEPHAROPLASTY Bilateral    CARDIAC CATHETERIZATION  2003   COLONOSCOPY     several x 3   JOINT REPLACEMENT  2015   left knee   PALATE SURGERY  2008   benign   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   Procedure: LEFT TOTAL HIP ARTHROPLASTY ANTERIOR APPROACH;  Surgeon: Jerri Kay HERO, MD;  Location: [facility] OR;  Service: Orthopedics;  Laterality: Left;  3-C   TOTAL HIP ARTHROPLASTY Right 05/03/2022   Procedure: RIGHT TOTAL HIP ARTHROPLASTY ANTERIOR APPROACH;  Surgeon: Jerri Kay HERO, MD;  Location: [facility] OR;  Service: Orthopedics;  Laterality: Right;   UMBILICAL HERNIA REPAIR  01/2024   UPPER GI ENDOSCOPY     several - stomach ulcers   Social History   Socioeconomic History   Marital status: Married    Spouse name: Not on file   Number of children: 1   Years of education: Not on file   Highest education level: Not on file  Occupational History   Not on file  Tobacco Use   Smoking status: Former    Current packs/day: 0.00    Average packs/day: 1 pack/day for 20.0 years (20.0 ttl pk-yrs)    Types: Cigarettes    Start date: 08/24/1971    Quit date: 08/24/1991    Years since quitting: 32.9   Smokeless tobacco: Never  Vaping Use   Vaping status: Never Used  Substance and Sexual Activity   Alcohol use: Not Currently   Drug use: Never   Sexual activity: Not Currently  Other Topics Concern   Not on file  Social History Narrative   Not on file   Social Drivers of Health   Financial Resource Strain: Not on file  Food Insecurity: Not on file  Transportation Needs: No Transportation Needs (06/06/2022)   Received from [facility]   OASIS A1250: Transportation    Lack of Transportation (Medical): No    Lack of  Transportation (Non-Medical): No    Patient Unable or Declines to Respond: No  Physical Activity: Not on file  Stress: Not on file  Social Connections: Not on file   History reviewed. No pertinent family history. Allergies  Allergen Reactions   Oxycodone Anxiety, Palpitations and Shortness Of Breath   Gemfibrozil Hives, Itching and Rash   Prior to Admission medications   Medication Sig Start Date End Date Taking? Authorizing Provider  amoxicillin  (AMOXIL ) 500 MG tablet Take 4 tablets by mouth 1 hour prior to dental procedure. 03/13/23  Yes Jerri Kay HERO, MD  cholecalciferol (VITAMIN D3) 25 MCG (1000 UNIT) tablet Take 1,000 Units by mouth daily.   Yes [provider]  docusate sodium  (COLACE) 100 MG capsule Take 1 capsule (100 mg total) by mouth daily as needed. 06/29/24 06/29/25  Jule Ronal CROME, PA-C  doxycycline  (VIBRA -TABS) 100 MG tablet Take 1 tablet (100 mg total) by mouth 2 (two) times daily. To be taken after surgery 06/29/24   Jule Ronal CROME, PA-C  fluticasone (FLONASE) 50 MCG/ACT nasal spray Place 1 spray into both nostrils daily as needed for allergies or rhinitis.   Yes [provider]  HYDROcodone -acetaminophen  (NORCO) 7.5-325 MG tablet Take 1-2 tablets by mouth every 6 (six) hours as needed for moderate pain (pain  score 4-6). To be taken after surgery 06/29/24   Jule Ronal CROME, PA-C  lisinopril -hydrochlorothiazide  (ZESTORETIC ) 10-12.5 MG tablet Take 1 tablet by mouth in the morning. 11/10/21  Yes [provider]  meloxicam  (MOBIC ) 15 MG tablet Take 15 mg by mouth daily.   Yes [provider]  metFORMIN  (GLUCOPHAGE -XR) 500 MG 24 hr tablet Take 2,000 mg by mouth every evening. 11/10/21  Yes [provider]  methocarbamol  (ROBAXIN ) 500 MG tablet Take 1 tablet (500 mg total) by mouth 2 (two) times daily as needed. 06/29/24   Jule Ronal CROME, PA-C  metoprolol  succinate (TOPROL -XL) 50 MG 24 hr tablet Take 50 mg by mouth at bedtime. 11/01/21   Yes [provider]  morphine  (MS CONTIN ) 30 MG 12 hr tablet Take 30 mg by mouth daily. 04/04/22  Yes [provider]  naloxone  (NARCAN ) nasal spray 4 mg/0.1 mL Place 1 spray into the nose as needed (opiate overdose). 09/25/21  Yes [provider]  nystatin-triamcinolone (MYCOLOG II) cream Apply 1 application topically 2 (two) times daily as needed for rash. 09/18/21  Yes [provider]  ondansetron  (ZOFRAN ) 4 MG tablet Take 1 tablet (4 mg total) by mouth every 8 (eight) hours as needed for nausea or vomiting. 06/29/24   Jule Ronal CROME, PA-C  senna (SENOKOT) 8.6 MG TABS tablet Take 1 tablet by mouth at bedtime.   Yes [provider]  simvastatin (ZOCOR) 20 MG tablet Take 20 mg by mouth at bedtime. 11/10/21  Yes [provider]  solifenacin (VESICARE) 5 MG tablet Take 5 mg by mouth in the morning. 04/28/24  Yes [provider]  traZODone  (DESYREL ) 50 MG tablet Take 50 mg by mouth at bedtime as needed for sleep. 11/29/21  Yes [provider]  morphine  (MSIR) 15 MG tablet Take 15 mg by mouth 2 (two) times daily as needed for moderate pain (pain score 4-6) or severe pain (pain score 7-10). 01/03/21   [provider]     Positive ROS: All other systems have been reviewed and were otherwise negative with the exception of those mentioned in the HPI and as above.  Physical Exam: General: Alert, no acute distress Cardiovascular: No pedal edema Respiratory: No cyanosis, no use of accessory musculature GI: abdomen soft Skin: No lesions in the area of chief complaint Neurologic: Sensation intact distally Psychiatric: Patient is competent for consent with normal mood and affect Lymphatic: no lymphedema  MUSCULOSKELETAL: exam stable  Assessment: right knee osteoarthritis  Plan: Plan for Procedure(s): ARTHROPLASTY, KNEE, TOTAL  The risks benefits and alternatives were discussed with the patient including but not limited  to the risks of nonoperative treatment, versus surgical intervention including infection, bleeding, nerve injury,  blood clots, cardiopulmonary complications, morbidity, mortality, among others, and they were willing to proceed.   Ozell Cummins, MD 07/13/2024 8:53 AM

## 2024-07-14 ENCOUNTER — Encounter (HOSPITAL_COMMUNITY): Payer: Self-pay | Admitting: Orthopaedic Surgery

## 2024-07-14 ENCOUNTER — Telehealth: Payer: Self-pay

## 2024-07-14 DIAGNOSIS — M1711 Unilateral primary osteoarthritis, right knee: Secondary | ICD-10-CM | POA: Diagnosis not present

## 2024-07-14 LAB — GLUCOSE, CAPILLARY
Glucose-Capillary: 162 mg/dL — ABNORMAL HIGH (ref 70–99)
Glucose-Capillary: 187 mg/dL — ABNORMAL HIGH (ref 70–99)

## 2024-07-14 MED ORDER — PANTOPRAZOLE SODIUM 40 MG IV SOLR
40.0000 mg | Freq: Once | INTRAVENOUS | Status: AC
  Administered 2024-07-14 (×2): 40 mg via INTRAVENOUS
  Filled 2024-07-14: qty 10

## 2024-07-14 NOTE — Telephone Encounter (Signed)
 Hospital called asking for orders for something for acid reflux She said patient said it was so bad he can't take his medicine

## 2024-07-14 NOTE — Discharge Summary (Signed)
 Patient ID: Kent Rodriguez MRN: [id number] DOB/AGE: 12-29-1945 78 y.o.  Admit date: 07/13/2024 Discharge date: 07/14/2024  Admission Diagnoses:  Principal Problem:   Primary osteoarthritis of right knee Active Problems:   Status post total right knee replacement   Discharge Diagnoses:  Same  Past Medical History:  Diagnosis Date   Back pain    lower back   COPD (chronic obstructive pulmonary disease) (HCC)    Dependent on wheelchair    Depression    pt denies   Diabetes mellitus without complication (HCC)    type 2   HLD (hyperlipidemia)    Hypertension    Insomnia    Osteoarthritis of left hip    Seasonal allergies    Sleep apnea    uses CPAP nightly   Spondylolysis of cervical region    Spondylolysis of lumbar region     Surgeries: Procedure(s): ARTHROPLASTY, KNEE, TOTAL on 07/13/2024   Consultants:   Discharged Condition: Improved  Hospital Course: Kent Rodriguez is an 78 y.o. male who was admitted 07/13/2024 for operative treatment ofPrimary osteoarthritis of right knee. Patient has severe unremitting pain that affects sleep, daily activities, and work/hobbies. After pre-op clearance the patient was taken to the operating room on 07/13/2024 and underwent  Procedure(s): ARTHROPLASTY, KNEE, TOTAL.    Patient was given perioperative antibiotics:  Anti-infectives (From admission, onward)    Start     Dose/Rate Route Frequency Ordered Stop   07/14/24 0000  ceFAZolin  (ANCEF ) IVPB 2g/100 mL premix        2 g 200 mL/hr over 30 Minutes Intravenous Every 6 hours 07/13/24 2352 07/14/24 0638   07/13/24 2200  doxycycline  (VIBRA -TABS) tablet 100 mg       Note to Pharmacy: To be taken after surgery     100 mg Oral 2 times daily 07/13/24 1418     07/13/24 1020  vancomycin  (VANCOCIN ) powder  Status:  Discontinued          As needed 07/13/24 1020 07/13/24 1239   07/13/24 1000  ceFAZolin  (ANCEF ) IVPB 2g/100 mL premix        2 g 200 mL/hr over 30 Minutes Intravenous  On call to O.R. 07/13/24 0954 07/13/24 1046   07/13/24 0822  ceFAZolin  (ANCEF ) 2-4 GM/100ML-% IVPB       Note to Pharmacy: Effie Rily O: cabinet override      07/13/24 0822 07/13/24 1102        Patient was given sequential compression devices, early ambulation, and chemoprophylaxis to prevent DVT.  Inpatient Morphine  Milligram Equivalents Per Day 8/11 - 8/12   Values displayed are in units of MME/Day    Order Start / End Date Yesterday Today    HYDROmorphone  (DILAUDID ) injection 0.25-0.5 mg 8/11 - 8/11 40 of 40-80 --    morphine  (MS CONTIN ) 12 hr tablet 30 mg 8/12 - No end date -- 0 of 30    HYDROcodone -acetaminophen  (NORCO/VICODIN) 5-325 MG per tablet 1-2 tablet 8/11 - No end date 20 of 15-30 10 of 30-60    HYDROmorphone  (DILAUDID ) injection 0.5-1 mg 8/11 - No end date 0 of 30-60 0 of 60-120    fentaNYL  (SUBLIMAZE ) 100 MCG/2ML injection 8/11 - 8/11 0 of Unknown --    fentaNYL  (SUBLIMAZE ) injection 50 mcg 8/11 - 8/11 15 of 15 --    HYDROmorphone  (DILAUDID ) injection 0.25-0.5 mg 8/11 - 8/11 20 of 20-40 --    HYDROmorphone  (DILAUDID ) injection 0.25-0.5 mg 8/11 - 8/11 20 of 20-40 --    Daily Totals  115 of Unknown (at least 140-265) 10 of 120-210    Calculation Errors     Order Type Date Details   fentaNYL  (SUBLIMAZE ) 100 MCG/2ML injection Ordered Dose -- Frequency type could not be determined            Patient benefited maximally from hospital stay and there were no complications.    Recent vital signs: Patient Vitals for the past 24 hrs:  BP Temp Temp src Pulse Resp SpO2  07/14/24 0518 118/64 98.6 F (37 C) Oral 65 -- 96 %  07/14/24 0025 117/62 98 F (36.7 C) Oral 80 18 95 %  07/13/24 2051 (!) 122/54 98.2 F (36.8 C) Oral 79 18 94 %  07/13/24 1544 129/70 98 F (36.7 C) Oral 82 16 97 %  07/13/24 1500 -- -- -- -- -- 94 %  07/13/24 1426 121/81 97.6 F (36.4 C) Oral 70 16 94 %  07/13/24 1400 -- 97.8 F (36.6 C) -- 79 12 93 %  07/13/24 1345 (!) 147/69 -- -- 71 13 96 %   07/13/24 1330 136/80 -- -- 74 17 95 %  07/13/24 1315 139/66 -- -- 72 14 93 %  07/13/24 1300 (!) 129/58 -- -- 69 12 94 %  07/13/24 1245 (!) 108/56 97.8 F (36.6 C) -- 72 13 94 %  07/13/24 0955 (!) (P) 105/93 -- -- (!) 53 17 98 %  07/13/24 0950 (!) 105/93 -- -- (!) 54 18 99 %  07/13/24 0945 117/62 -- -- (!) 55 17 98 %     Recent laboratory studies: No results for input(s): WBC, HGB, HCT, PLT, NA, K, CL, CO2, BUN, CREATININE, GLUCOSE, INR, CALCIUM in the last 72 hours.  Invalid input(s): PT, 2   Discharge Medications:   Allergies as of 07/14/2024       Reactions   Oxycodone Anxiety, Palpitations, Shortness Of Breath   Gemfibrozil Hives, Itching, Rash        Medication List     STOP taking these medications    meloxicam  15 MG tablet Commonly known as: MOBIC    morphine  15 MG tablet Commonly known as: MSIR   morphine  30 MG 12 hr tablet Commonly known as: MS CONTIN        TAKE these medications    amoxicillin  500 MG tablet Commonly known as: AMOXIL  Take 4 tablets by mouth 1 hour prior to dental procedure.   cholecalciferol 25 MCG (1000 UNIT) tablet Commonly known as: VITAMIN D3 Take 1,000 Units by mouth daily.   docusate sodium  100 MG capsule Commonly known as: Colace Take 1 capsule (100 mg total) by mouth daily as needed.   doxycycline  100 MG tablet Commonly known as: VIBRA -TABS Take 1 tablet (100 mg total) by mouth 2 (two) times daily. To be taken after surgery   Eliquis  2.5 MG Tabs tablet Generic drug: apixaban  Take on tab by mouth twice daily x 30 days after surgery to prevent blood clots   fluticasone 50 MCG/ACT nasal spray Commonly known as: FLONASE Place 1 spray into both nostrils daily as needed for allergies or rhinitis.   HYDROcodone -acetaminophen  7.5-325 MG tablet Commonly known as: NORCO Take 1-2 tablets by mouth every 6 (six) hours as needed for moderate pain (pain score 4-6). To be taken after surgery    lisinopril -hydrochlorothiazide  10-12.5 MG tablet Commonly known as: ZESTORETIC  Take 1 tablet by mouth in the morning.   metFORMIN  500 MG 24 hr tablet Commonly known as: GLUCOPHAGE -XR Take 2,000 mg by mouth every evening.  methocarbamol  500 MG tablet Commonly known as: ROBAXIN  Take 1 tablet (500 mg total) by mouth 2 (two) times daily as needed.   metoprolol  succinate 50 MG 24 hr tablet Commonly known as: TOPROL -XL Take 50 mg by mouth at bedtime.   naloxone  4 MG/0.1ML Liqd nasal spray kit Commonly known as: NARCAN  Place 1 spray into the nose as needed (opiate overdose).   nystatin-triamcinolone cream Commonly known as: MYCOLOG II Apply 1 application topically 2 (two) times daily as needed for rash.   ondansetron  4 MG tablet Commonly known as: Zofran  Take 1 tablet (4 mg total) by mouth every 8 (eight) hours as needed for nausea or vomiting.   senna 8.6 MG Tabs tablet Commonly known as: SENOKOT Take 1 tablet by mouth at bedtime.   simvastatin 20 MG tablet Commonly known as: ZOCOR Take 20 mg by mouth at bedtime.   solifenacin 5 MG tablet Commonly known as: VESICARE Take 5 mg by mouth in the morning.   traZODone  50 MG tablet Commonly known as: DESYREL  Take 50 mg by mouth at bedtime as needed for sleep.               Durable Medical Equipment  (From admission, onward)           Start     Ordered   07/13/24 1419  DME Walker rolling  Once       Question Answer Comment  Walker: With 5 Inch Wheels   Patient needs a walker to treat with the following condition Status post left partial knee replacement      07/13/24 1418   07/13/24 1419  DME 3 n 1  Once        07/13/24 1418   07/13/24 1419  DME Bedside commode  Once       Question:  Patient needs a bedside commode to treat with the following condition  Answer:  Status post left partial knee replacement   07/13/24 1418            Diagnostic Studies: DG Knee Right Port Result Date:  07/13/2024 CLINICAL DATA:  Right knee pain. EXAM: DG KNEE 1-2V PORT*R* COMPARISON:  None Available. FINDINGS: Right knee arthroplasty in expected alignment. No periprosthetic lucency or fracture. There has been patellar resurfacing. Recent postsurgical change includes air and edema in the soft tissues and joint space. IMPRESSION: Right knee arthroplasty without immediate postoperative complication. Electronically Signed   By: Andrea Gasman M.D.   On: 07/13/2024 14:23    Disposition: Discharge disposition: 01-Home or Self Care          Follow-up Information     Jule Ronal CROME, PA-C. Schedule an appointment as soon as possible for a visit in 2 week(s).   Specialty: Orthopedic Surgery Contact information: [address] [place]  [place] [address] [phone]                  Signed: Ronal CROME Jule 07/14/2024, 8:13 AM

## 2024-07-14 NOTE — Telephone Encounter (Signed)
 Spoke to nurse

## 2024-07-14 NOTE — Plan of Care (Signed)
  Problem: Activity: Goal: Ability to avoid complications of mobility impairment will improve Outcome: Adequate for Discharge Goal: Range of joint motion will improve Outcome: Adequate for Discharge   Problem: Skin Integrity: Goal: Will show signs of wound healing Outcome: Adequate for Discharge   Problem: Education: Goal: Knowledge of the prescribed therapeutic regimen will improve Outcome: Completed/Met   Problem: Clinical Measurements: Goal: Postoperative complications will be avoided or minimized Outcome: Completed/Met   Problem: Pain Management: Goal: Pain level will decrease with appropriate interventions Outcome: Completed/Met

## 2024-07-14 NOTE — Plan of Care (Signed)
  Problem: Education: Goal: Knowledge of General Education information will improve Description: Including pain rating scale, medication(s)/side effects and non-pharmacologic comfort measures Outcome: Adequate for Discharge   Problem: Health Behavior/Discharge Planning: Goal: Ability to manage health-related needs will improve Outcome: Adequate for Discharge   Problem: Clinical Measurements: Goal: Ability to maintain clinical measurements within normal limits will improve Outcome: Adequate for Discharge Goal: Will remain free from infection Outcome: Adequate for Discharge Goal: Diagnostic test results will improve Outcome: Adequate for Discharge Goal: Respiratory complications will improve Outcome: Adequate for Discharge Goal: Cardiovascular complication will be avoided Outcome: Adequate for Discharge   Problem: Activity: Goal: Risk for activity intolerance will decrease Outcome: Adequate for Discharge   Problem: Nutrition: Goal: Adequate nutrition will be maintained Outcome: Adequate for Discharge   Problem: Coping: Goal: Level of anxiety will decrease Outcome: Adequate for Discharge   Problem: Elimination: Goal: Will not experience complications related to bowel motility Outcome: Adequate for Discharge Goal: Will not experience complications related to urinary retention Outcome: Adequate for Discharge   Problem: Pain Managment: Goal: General experience of comfort will improve and/or be controlled Outcome: Adequate for Discharge   Problem: Safety: Goal: Ability to remain free from injury will improve Outcome: Adequate for Discharge   Problem: Skin Integrity: Goal: Risk for impaired skin integrity will decrease Outcome: Adequate for Discharge   Problem: Education: Goal: Ability to describe self-care measures that may prevent or decrease complications (Diabetes Survival Skills Education) will improve Outcome: Adequate for Discharge Goal: Individualized Educational  Video(s) Outcome: Adequate for Discharge   Problem: Coping: Goal: Ability to adjust to condition or change in health will improve Outcome: Adequate for Discharge   Problem: Fluid Volume: Goal: Ability to maintain a balanced intake and output will improve Outcome: Adequate for Discharge   Problem: Health Behavior/Discharge Planning: Goal: Ability to identify and utilize available resources and services will improve Outcome: Adequate for Discharge Goal: Ability to manage health-related needs will improve Outcome: Adequate for Discharge   Problem: Metabolic: Goal: Ability to maintain appropriate glucose levels will improve Outcome: Adequate for Discharge   Problem: Nutritional: Goal: Maintenance of adequate nutrition will improve Outcome: Adequate for Discharge Goal: Progress toward achieving an optimal weight will improve Outcome: Adequate for Discharge   Problem: Skin Integrity: Goal: Risk for impaired skin integrity will decrease Outcome: Adequate for Discharge   Problem: Tissue Perfusion: Goal: Adequacy of tissue perfusion will improve Outcome: Adequate for Discharge   Problem: Education: Goal: Knowledge of the prescribed therapeutic regimen will improve Outcome: Adequate for Discharge Goal: Individualized Educational Video(s) Outcome: Adequate for Discharge   Problem: Activity: Goal: Ability to avoid complications of mobility impairment will improve Outcome: Adequate for Discharge Goal: Range of joint motion will improve Outcome: Adequate for Discharge   Problem: Clinical Measurements: Goal: Postoperative complications will be avoided or minimized Outcome: Adequate for Discharge   Problem: Pain Management: Goal: Pain level will decrease with appropriate interventions Outcome: Adequate for Discharge   Problem: Skin Integrity: Goal: Will show signs of wound healing Outcome: Adequate for Discharge

## 2024-07-14 NOTE — TOC Transition Note (Signed)
 Transition of Care (TOC) - Discharge Note Rayfield Gobble RN, BSN Inpatient Care Management Unit 4E- RN Case Manager See Treatment Team for direct phone # [phone] cross coverage  Patient Details  Name: Kent Rodriguez MRN: [id number] Date of Birth: 1946-08-03  Transition of Care [facility]) CM/SW Contact:  Gobble Rayfield Hurst, RN Phone Number: 07/14/2024, 12:35 PM   Clinical Narrative:    Pt stable for transition home today, Orders placed for [facility] and DME.   CM to bedside to speak with pt, spouse also present. Per pt he has used [facility] in past and would like to use them again- pt provided contact info as well as therapist names that saw him before Cornelious Imus, Greig Bunker).   Discussed DME- per pt and spouse he has all needed DME at home- no new needs noted. Spouse to transport home.   Address, phone #s and PCP all confirmed in Epic.   Call made to [facility] [phone])- spoke w/ April in intake- referral has been accepted and April voiced that they can do start of care for both PTOT in a timely fashion.  Orders and notes faxed to April at [place]- fax # [phone]  No further CM needs noted.    Final next level of care: Home w Home Health Services Barriers to Discharge: No Barriers Identified   Patient Goals and CMS Choice Patient states their goals for this hospitalization and ongoing recovery are:: return home   Choice offered to / list presented to : Patient, Spouse      Discharge Placement               Home w/ [facility]        Discharge Plan and Services Additional resources added to the After Visit Summary for     Discharge Planning Services: CM Consult Post Acute Care Choice: Home Health, Durable Medical Equipment          DME Arranged: N/A DME Agency: NA       HH Arranged: PT, OT HH Agency: Other - See comment [facility]) Date HH Agency Contacted: 07/14/24 Time HH Agency Contacted: 1140 Representative spoke with at  [facility] Agency: April  Social Drivers of Health (SDOH) Interventions SDOH Screenings   Food Insecurity: No Food Insecurity (07/13/2024)  Housing: Low Risk  (07/13/2024)  Transportation Needs: No Transportation Needs (07/13/2024)  Utilities: Not At Risk (07/13/2024)  Social Connections: Unknown (07/13/2024)  Tobacco Use: Medium Risk (07/13/2024)  Health Literacy: Adequate Health Literacy (06/06/2022)   Received from [facility]     Readmission Risk Interventions     No data to display

## 2024-07-14 NOTE — Progress Notes (Signed)
 Physical Therapy Treatment Patient Details Name: Kent Rodriguez MRN: [id number] DOB: [birth year] Today's Date: 07/14/2024   History of Present Illness Pt is 78 year old presented to [facility] on  07/13/24 for rt TKR. PMH - COPD, DM, depression, HLD, HTN, OA, back pain, Lt TKA in 2005, Rt THR 05/2022, Lt THR 01/2022.    PT Comments  Pt resting in bed on arrival, pleasant and agreeable to session with steady progress towards acute goals. Pt demonstrating bed mobility with light min A to elevate trunk, and transfers and gait with grossly CGA to supervision for safety with no LOB noted. Pt was educated on continued walker use to maximize functional independence, safety, and decrease risk for falls as well as appropriate activity progression, safe car entry/exit, ice, HEP and compliance and importance of continued mobility with pt verbalizing understanding. Anticipate safe discharge, with assist level outlined below, once medically cleared, will continue to follow acutely.       If plan is discharge home, recommend the following: Assistance with cooking/housework;Assist for transportation;A little help with bathing/dressing/bathroom   Can travel by private vehicle        Equipment Recommendations  None recommended by PT    Recommendations for Other Services       Precautions / Restrictions Precautions Precautions: Knee Restrictions Weight Bearing Restrictions Per Provider Order: Yes RLE Weight Bearing Per Provider Order: Weight bearing as tolerated     Mobility  Bed Mobility Overal bed mobility: Needs Assistance Bed Mobility: Supine to Sit     Supine to sit: Min assist, HOB elevated     General bed mobility comments: Assist to elevate trunk into sitting    Transfers Overall transfer level: Needs assistance Equipment used: Rolling walker (2 wheels) Transfers: Sit to/from Stand Sit to Stand: Contact guard assist           General transfer comment: For safety, good recall for  hand placement    Ambulation/Gait Ambulation/Gait assistance: Contact guard assist, Supervision Gait Distance (Feet): 110 Feet (+ 15') Assistive device: Rolling walker (2 wheels) Gait Pattern/deviations: Step-through pattern, Decreased stance time - right, Step-to pattern Gait velocity: decr     General Gait Details: CGA progressing to supervision for safety, step-to progressing to step through with distance, distance limited by pain and fatigue   Stairs             Wheelchair Mobility     Tilt Bed    Modified Rankin (Stroke Patients Only)       Balance Overall balance assessment: Mild deficits observed, not formally tested                                          Communication Communication Communication: No apparent difficulties  Cognition Arousal: Alert Behavior During Therapy: WFL for tasks assessed/performed   PT - Cognitive impairments: No apparent impairments                         Following commands: Intact      Cueing Cueing Techniques: Verbal cues  Exercises      General Comments General comments (skin integrity, edema, etc.): VSS      Pertinent Vitals/Pain Pain Assessment Pain Assessment: Faces Faces Pain Scale: Hurts little more Pain Location: rt knee Pain Descriptors / Indicators: Aching, Guarding Pain Intervention(s): Monitored during session, Limited activity within patient's tolerance, Repositioned, Premedicated before  session, Ice applied    Home Living                          Prior Function            PT Goals (current goals can now be found in the care plan section) Acute Rehab PT Goals Patient Stated Goal: return home PT Goal Formulation: With patient Time For Goal Achievement: 07/17/24 Progress towards PT goals: Progressing toward goals    Frequency    7X/week      PT Plan      Co-evaluation              AM-PAC PT 6 Clicks Mobility   Outcome Measure  Help  needed turning from your back to your side while in a flat bed without using bedrails?: A Little Help needed moving from lying on your back to sitting on the side of a flat bed without using bedrails?: A Little Help needed moving to and from a bed to a chair (including a wheelchair)?: A Little Help needed standing up from a chair using your arms (e.g., wheelchair or bedside chair)?: A Little Help needed to walk in hospital room?: A Little Help needed climbing 3-5 steps with a railing? : A Little 6 Click Score: 18    End of Session Equipment Utilized During Treatment: Gait belt Activity Tolerance: Patient tolerated treatment well Patient left: in chair;with call bell/phone within reach;with nursing/sitter in room;Other (comment) (with ice applied) Nurse Communication: Mobility status PT Visit Diagnosis: Other abnormalities of gait and mobility (R26.89);Difficulty in walking, not elsewhere classified (R26.2);Pain Pain - Right/Left: Right Pain - part of body: Knee     Time: 9155-9081 PT Time Calculation (min) (ACUTE ONLY): 34 min  Charges:    $Gait Training: 23-37 mins PT General Charges $$ ACUTE PT VISIT: 1 Visit                     Kent Rodriguez R. PTA Acute Rehabilitation Services Office: [phone]   Kent Rodriguez 07/14/2024, 9:22 AM

## 2024-07-14 NOTE — Progress Notes (Signed)
 Discharge instructions reviewed with pt and his husband/significant other, both verbalize understanding of instructions.  Copy of instructions given to pt. [facility] filled script for eliquis  and was picked up by the pt's husband. Pt states a pharmacist did education with him on the medication, eliquis  handouts included in discharge instructions. Pt will be discharged with his walker, ice man machine and his IS.  Pt will be d/c'd via pt's own wheelchair with his belongings and will be escorted by staff.   Negin Hegg,RN SWOT

## 2024-07-14 NOTE — Progress Notes (Addendum)
 Subjective: 1 Day Post-Op Procedure(s) (LRB): ARTHROPLASTY, KNEE, TOTAL (Right) Patient reports pain as mild.  C/o nausea/acid reflux.  Objective: Vital signs in last 24 hours: Temp:  [97.6 F (36.4 C)-98.9 F (37.2 C)] 98.6 F (37 C) (08/12 0518) Pulse Rate:  [53-82] 65 (08/12 0518) Resp:  [12-18] 18 (08/12 0025) BP: (105-147)/(54-93) 118/64 (08/12 0518) SpO2:  [93 %-99 %] 96 % (08/12 0518) Weight:  [107.5 kg] 107.5 kg (08/11 0811)  Intake/Output from previous day: 08/11 0701 - 08/12 0700 In: 2098.6 [P.O.:840; I.V.:958.6; IV Piggyback:300] Out: 420 [Urine:400; Blood:20] Intake/Output this shift: No intake/output data recorded.  No results for input(s): HGB in the last 72 hours. No results for input(s): WBC, RBC, HCT, PLT in the last 72 hours. No results for input(s): NA, K, CL, CO2, BUN, CREATININE, GLUCOSE, CALCIUM in the last 72 hours. No results for input(s): LABPT, INR in the last 72 hours.  Neurologically intact Neurovascular intact Sensation intact distally Intact pulses distally Dorsiflexion/Plantar flexion intact Incision: dressing C/D/I No cellulitis present Compartment soft   Assessment/Plan: 1 Day Post-Op Procedure(s) (LRB): ARTHROPLASTY, KNEE, TOTAL (Right) Advance diet Up with therapy D/C IV fluids Discharge home with home health once cleared by PT WBAT RLE POST-OP MEDS (WITH THE EXCEPTION OF ELIQUIS ) SENT TO PHARMACY ON FILE PRIOR TO SURGERY.  ELIQUIS  SENT TO TOC YESTERDAY.      Ronal LITTIE Grave 07/14/2024, 8:09 AM

## 2024-07-14 NOTE — Evaluation (Signed)
 Occupational Therapy Evaluation Patient Details Name: Kent Rodriguez MRN: [id number] DOB: 1946-08-11 Today's Date: 07/14/2024   History of Present Illness   Pt is 78 year old presented to [facility] on  07/13/24 for rt TKR. PMH - COPD, DM, depression, HLD, HTN, OA, back pain, Lt TKA in 2005, Rt THR 05/2022, Lt THR 01/2022.     Clinical Impressions Pt is at min A level with Lb ADLs, CGA with toileting, mobility and transfers. Pt will have 24/7 assist at home from spouse and all necessary DME and A/E at home from previous ortho surgeries. PTA spouse assist with lower body dressing, can use dressing stick for underwear, no assistive device ambulating in home with occasional use of RW, uses power w/c as transportation around retirement community. Pt eager to d.c home this afternoon. All education completed and no further acute OT services are indicated at this time, OT will sign off     If plan is discharge home, recommend the following:   A little help with bathing/dressing/bathroom;A little help with walking and/or transfers;Help with stairs or ramp for entrance;Assist for transportation     Functional Status Assessment   Patient has had a recent decline in their functional status and demonstrates the ability to make significant improvements in function in a reasonable and predictable amount of time.     Equipment Recommendations   None recommended by OT     Recommendations for Other Services         Precautions/Restrictions   Precautions Precautions: Knee Restrictions Weight Bearing Restrictions Per Provider Order: Yes RLE Weight Bearing Per Provider Order: Weight bearing as tolerated     Mobility Bed Mobility Overal bed mobility: Needs Assistance Bed Mobility: Sit to Supine       Sit to supine: Min assist   General bed mobility comments: sitting EOB upon arrival, min A with LE back onto bed    Transfers Overall transfer level: Needs assistance Equipment  used: Rolling walker (2 wheels) Transfers: Sit to/from Stand Sit to Stand: Contact guard assist                  Balance Overall balance assessment: Mild deficits observed, not formally tested                                         ADL either performed or assessed with clinical judgement   ADL Overall ADL's : Needs assistance/impaired Eating/Feeding: Independent   Grooming: Wash/dry hands;Wash/dry face;Supervision/safety;Standing   Upper Body Bathing: Set up   Lower Body Bathing: Minimal assistance   Upper Body Dressing : Set up   Lower Body Dressing: Minimal assistance   Toilet Transfer: Contact guard assist;Ambulation;Rolling walker (2 wheels);Regular Toilet;Grab bars   Toileting- Clothing Manipulation and Hygiene: Contact guard assist;Sit to/from stand       Functional mobility during ADLs: Contact guard assist;Rolling walker (2 wheels) General ADL Comments: pt has ADL A/E at home from previous ortho surgeries, spouse assists with LB dressing at baseline     Vision Baseline Vision/History: 1 Wears glasses Ability to See in Adequate Light: 0 Adequate Patient Visual Report: No change from baseline       Perception         Praxis         Pertinent Vitals/Pain Pain Assessment Pain Assessment: Faces Faces Pain Scale: Hurts a little bit Pain Location: R knee Pain Descriptors / Indicators:  Aching Pain Intervention(s): Monitored during session, Repositioned     Extremity/Trunk Assessment Upper Extremity Assessment Upper Extremity Assessment: Overall WFL for tasks assessed   Lower Extremity Assessment Lower Extremity Assessment: Defer to PT evaluation   Cervical / Trunk Assessment Cervical / Trunk Assessment: Normal   Communication Communication Communication: No apparent difficulties   Cognition Arousal: Alert Behavior During Therapy: WFL for tasks assessed/performed               OT - Cognition Comments: was a  Associate Professor back in [place]                 Following commands: Intact       Cueing  General Comments   Cueing Techniques: Verbal cues      Exercises     Shoulder Instructions      Home Living Family/patient expects to be discharged to:: Private residence Living Arrangements: Spouse/significant other Available Help at Discharge: Family;Available 24 hours/day Type of Home: Independent living facility Home Access: Level entry     Home Layout: One level     Bathroom Shower/Tub: Producer, television/film/video: Handicapped height Bathroom Accessibility: Yes   Home Equipment: Agricultural consultant (2 wheels);Rollator (4 wheels);Wheelchair - manual;Wheelchair - power;Grab bars - toilet;Grab bars - tub/shower;Shower seat;Adaptive equipment Adaptive Equipment: Reacher;Sock aid;Long-handled sponge        Prior Functioning/Environment Prior Level of Function : Needs assist       Physical Assist : ADLs (physical)   ADLs (physical): Dressing;IADLs Mobility Comments: No assistive device ambulating in home. Occasional uses rolling walker. Uses power w/c as transportation around retirement communtiy, ADLs Comments: Spouse assist with lower body dressing. Can use dressing stick for underwear    OT Problem List: Decreased activity tolerance;Pain;Impaired balance (sitting and/or standing)   OT Treatment/Interventions:        OT Goals(Current goals can be found in the care plan section)   Acute Rehab OT Goals Patient Stated Goal: go home OT Goal Formulation: All assessment and education complete, DC therapy   OT Frequency:       Co-evaluation              AM-PAC OT 6 Clicks Daily Activity     Outcome Measure Help from another person eating meals?: None Help from another person taking care of personal grooming?: A Little Help from another person toileting, which includes using toliet, bedpan, or urinal?: A Little Help from another person bathing  (including washing, rinsing, drying)?: A Little Help from another person to put on and taking off regular upper body clothing?: A Little Help from another person to put on and taking off regular lower body clothing?: A Little 6 Click Score: 19   End of Session Equipment Utilized During Treatment: Gait belt;Rolling walker (2 wheels) Nurse Communication: Mobility status  Activity Tolerance: Patient tolerated treatment well Patient left: in bed;with call bell/phone within reach  OT Visit Diagnosis: Other abnormalities of gait and mobility (R26.89);Muscle weakness (generalized) (M62.81);Pain Pain - Right/Left: Right Pain - part of body: Knee                Time: 1002-1027 OT Time Calculation (min): 25 min Charges:  OT General Charges $OT Visit: 1 Visit OT Evaluation $OT Eval Moderate Complexity: 1 Mod OT Treatments $Therapeutic Activity: 8-22 mins    Jacques Karna Loose 07/14/2024, 2:23 PM

## 2024-07-14 NOTE — Anesthesia Postprocedure Evaluation (Signed)
 Anesthesia Post Note  Patient: Kent Rodriguez  Procedure(s) Performed: ARTHROPLASTY, KNEE, TOTAL (Right: Knee)     Patient location during evaluation: PACU Anesthesia Type: Spinal Level of consciousness: awake and alert Pain management: pain level controlled Vital Signs Assessment: post-procedure vital signs reviewed and stable Respiratory status: spontaneous breathing Cardiovascular status: stable Anesthetic complications: no   No notable events documented.  Last Vitals:  Vitals:   07/14/24 0518 07/14/24 0825  BP: 118/64 (!) 132/56  Pulse: 65 (!) 57  Resp:  18  Temp: 37 C 37 C  SpO2: 96% 96%    Last Pain:  Vitals:   07/14/24 0825  TempSrc: Oral  PainSc:                  Norleen Pope

## 2024-07-20 ENCOUNTER — Other Ambulatory Visit: Payer: Self-pay | Admitting: Surgical

## 2024-07-20 ENCOUNTER — Telehealth: Payer: Self-pay

## 2024-07-20 MED ORDER — HYDROMORPHONE HCL 2 MG PO TABS: 2.0000 mg | ORAL_TABLET | Freq: Four times a day (QID) | ORAL | 0 refills | Status: AC | PRN

## 2024-07-20 NOTE — Telephone Encounter (Signed)
 Patient called triage. He is s/p right TKA with Dr.Xu on 07/13/2024. He called with concerns of an allergic reaction. He has developed whelps and itching over his body, that started just on his legs. Last night his upper lip was swollen, but this morning it was back to normal and now the lower lip is swollen. Eyelids are swollen. No shortness of breath at all. No fever. He was sweating a lot last night, but not today.  He has also developed a sharp pain in his stomach that comes and goes. He said that the pain feels like an ulcer. He does eat when taking his medicines. He is highly allergic to oxycodone, but has always tolerated hydrocodone  well. He is almost out of the hydrocodone , and is due for his next dose. I ask that he hold off until I speak with you first.  Please advise or call patient. [phone]   Thanks so much!!!

## 2024-07-20 NOTE — Telephone Encounter (Signed)
 I can patient and discussed.  Not having any shortness of breath right now.  Discontinued hydrocodone  after he finishes course.  Will try and switch medications to Dilaudid .  He did have hydrocodone  in the past but it has been several years and so he may be experiencing similar reaction to the oxycodone that he has had previously.  No other new medications for him aside from the blood thinner and doxycycline .  He is not having any shortness of breath.  He will try taking Benadryl  along with the new pain medication and see if this helps.  Recommended he call the office on-call number and we can discuss further options if this is not helpful.

## 2024-07-28 ENCOUNTER — Ambulatory Visit (INDEPENDENT_AMBULATORY_CARE_PROVIDER_SITE_OTHER): Admitting: Physician Assistant

## 2024-07-28 ENCOUNTER — Other Ambulatory Visit: Payer: Self-pay | Admitting: Physician Assistant

## 2024-07-28 DIAGNOSIS — Z96651 Presence of right artificial knee joint: Secondary | ICD-10-CM

## 2024-07-28 MED ORDER — HYDROCODONE-ACETAMINOPHEN 5-325 MG PO TABS: 1.0000 | ORAL_TABLET | Freq: Three times a day (TID) | ORAL | 0 refills | Status: DC | PRN

## 2024-07-28 NOTE — Progress Notes (Signed)
 Post-Op Visit Note   Patient: Kent Rodriguez           Date of Birth: [birth year]           MRN: [id number] Visit Date: 07/28/2024 PCP: Zita Reges, MD   Assessment & Plan:  Chief Complaint:  Chief Complaint  Patient presents with   Right Knee - Routine Post Op, Pain   Visit Diagnoses:  1. Status post total right knee replacement     Plan: Patient is a pleasant 78 year old gentleman who comes in today 2 weeks status post right total knee replacement.  He has been doing relatively well in regards to the knee with the exception of an allergic reaction to what he thinks may have been the doxycycline .  Once he stopped taking this and started taking Benadryl  he has been much better.  Around that same time, however he stopped taking his Norco and was taking hydromorphone  instead.  He does tell me he has taken Norco in the past without any allergic reaction.  He has also been compliant taking Eliquis  twice daily for DVT prophylaxis.  He has been getting home health PT and is currently using his electric wheelchair at today's visit.  Examination of his right knee reveals a well-healing surgical incision with nylon sutures in place.  No evidence of infection or cellulitis.  Calves are soft nontender.  He is neurovascularly intact distally.  Today, sutures were removed and Steri-Strips applied.  I sent in a referral for outpatient PT in [place] .  Have also refilled his Norco.  He will follow-up with us  in 4 weeks for repeat evaluation and 2 view x-rays of the right knee.  Call with concerns or questions.  Follow-Up Instructions: Return in about 4 weeks (around 08/25/2024).   Orders:  No orders of the defined types were placed in this encounter.  No orders of the defined types were placed in this encounter.   Imaging: No results found.  PMFS History: Patient Active Problem List   Diagnosis Date Noted   Status post total right knee replacement 07/13/2024   Status  post total replacement of right hip 05/03/2022   Primary osteoarthritis of right hip 03/06/2022   Primary osteoarthritis of left hip 01/22/2022   Status post total replacement of left hip 01/22/2022   High blood cholesterol 11/06/2021   Insomnia 11/06/2021   Diabetes mellitus without complication (HCC) 07/16/2019   Essential hypertension 07/16/2019   Primary osteoarthritis involving multiple joints 07/16/2019   Screening for prostate cancer 07/16/2019   Obstructive sleep apnea (adult) (pediatric) 02/26/2019   Morbid (severe) obesity due to excess calories (HCC) 02/26/2019   Cervical spondylosis 02/02/2019   Lumbar spondylosis 02/02/2019   Depression, unspecified 02/02/2019   Dyspnea on exertion 02/02/2019   Hand pain 02/02/2019   Chest pain with high risk for cardiac etiology 02/02/2019   Hip pain 02/02/2019   Knee pain 02/02/2019   Localized, primary osteoarthritis 02/02/2019   Low back pain 02/02/2019   Neck pain 02/02/2019   Primary osteoarthritis of right knee 02/02/2019   Past Medical History:  Diagnosis Date   Back pain    lower back   COPD (chronic obstructive pulmonary disease) (HCC)    Dependent on wheelchair    Depression    pt denies   Diabetes mellitus without complication (HCC)    type 2   HLD (hyperlipidemia)    Hypertension    Insomnia    Osteoarthritis of left hip    Seasonal allergies  Sleep apnea    uses CPAP nightly   Spondylolysis of cervical region    Spondylolysis of lumbar region     No family history on file.  Past Surgical History:  Procedure Laterality Date   BLEPHAROPLASTY Bilateral    CARDIAC CATHETERIZATION  2003   COLONOSCOPY     several x 3   JOINT REPLACEMENT  2015   left knee   PALATE SURGERY  2008   benign   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   Procedure: LEFT TOTAL HIP ARTHROPLASTY ANTERIOR APPROACH;  Surgeon: Jerri Kay HERO, MD;  Location: [facility];  Service: Orthopedics;  Laterality: Left;  3-C   TOTAL HIP ARTHROPLASTY  Right 05/03/2022   Procedure: RIGHT TOTAL HIP ARTHROPLASTY ANTERIOR APPROACH;  Surgeon: Jerri Kay HERO, MD;  Location: [facility];  Service: Orthopedics;  Laterality: Right;   TOTAL KNEE ARTHROPLASTY Right 07/13/2024   Procedure: ARTHROPLASTY, KNEE, TOTAL;  Surgeon: Jerri Kay HERO, MD;  Location: [facility];  Service: Orthopedics;  Laterality: Right;   UMBILICAL HERNIA REPAIR  01/2024   UPPER GI ENDOSCOPY     several - stomach ulcers   Social History   Occupational History   Not on file  Tobacco Use   Smoking status: Former    Current packs/day: 0.00    Average packs/day: 1 pack/day for 20.0 years (20.0 ttl pk-yrs)    Types: Cigarettes    Start date: 08/24/1971    Quit date: 08/24/1991    Years since quitting: 32.9   Smokeless tobacco: Never  Vaping Use   Vaping status: Never Used  Substance and Sexual Activity   Alcohol use: Not Currently   Drug use: Never   Sexual activity: Not Currently

## 2024-08-05 ENCOUNTER — Encounter: Payer: Self-pay | Admitting: Orthopaedic Surgery

## 2024-08-06 ENCOUNTER — Other Ambulatory Visit: Payer: Self-pay | Admitting: Physician Assistant

## 2024-08-06 MED ORDER — HYDROCODONE-ACETAMINOPHEN 5-325 MG PO TABS: 1.0000 | ORAL_TABLET | Freq: Two times a day (BID) | ORAL | 0 refills | Status: AC | PRN

## 2024-08-06 NOTE — Telephone Encounter (Signed)
 Sent to wg on file

## 2024-08-07 ENCOUNTER — Other Ambulatory Visit: Payer: Self-pay | Admitting: Physician Assistant

## 2024-08-07 MED ORDER — METHOCARBAMOL 500 MG PO TABS: 500.0000 mg | ORAL_TABLET | Freq: Two times a day (BID) | ORAL | 2 refills | Status: AC | PRN

## 2024-08-07 NOTE — Telephone Encounter (Signed)
 sent

## 2024-08-26 ENCOUNTER — Ambulatory Visit (INDEPENDENT_AMBULATORY_CARE_PROVIDER_SITE_OTHER): Admitting: Physician Assistant

## 2024-08-26 ENCOUNTER — Other Ambulatory Visit: Payer: Self-pay

## 2024-08-26 DIAGNOSIS — Z96651 Presence of right artificial knee joint: Secondary | ICD-10-CM | POA: Diagnosis not present

## 2024-08-26 MED ORDER — HYDROCODONE-ACETAMINOPHEN 5-325 MG PO TABS: 1.0000 | ORAL_TABLET | Freq: Two times a day (BID) | ORAL | 0 refills | Status: AC | PRN

## 2024-08-26 NOTE — Progress Notes (Signed)
 Post-Op Visit Note   Patient: Kent Rodriguez           Date of Birth: 1946/03/15           MRN: [id number] Visit Date: 08/26/2024 PCP: Zita Reges, MD   Assessment & Plan:  Chief Complaint:  Chief Complaint  Patient presents with   Right Knee - Follow-up    Right total knee arthroplasty 07/13/2024   Visit Diagnoses:  1. Status post total right knee replacement     Plan: Patient is a pleasant 78 year old gentleman who comes in today 6 weeks status post right total knee replacement 07/13/2024.  He has been doing great.  He has been in outpatient physical therapy making progress.  He takes an occasional Norco or Tylenol  3.  Ambulating without assistance.  He has finished his baby aspirin  for which he was taking for DVT prophylaxis.  Examination of the right knee reveals a fully healed surgical scar without complication.  Range of motion 0 to 100 degrees.  Stable to valgus varus stress.  He is neurovascular intact distally.  At this point, he will continue to push things in physical therapy.  He will follow-up with us  in 6 weeks for recheck.  Call with concerns or questions.  Follow-Up Instructions: Return in about 6 weeks (around 10/07/2024).   Orders:  Orders Placed This Encounter  Procedures   XR Knee 1-2 Views Right   Meds ordered this encounter  Medications   HYDROcodone -acetaminophen  (NORCO/VICODIN) 5-325 MG tablet    Sig: Take 1 tablet by mouth 2 (two) times daily as needed.    Dispense:  20 tablet    Refill:  0    Imaging: XR Knee 1-2 Views Right Result Date: 08/26/2024 Well-seated prosthesis without complication   PMFS History: Patient Active Problem List   Diagnosis Date Noted   Status post total right knee replacement 07/13/2024   Status post total replacement of right hip 05/03/2022   Primary osteoarthritis of right hip 03/06/2022   Primary osteoarthritis of left hip 01/22/2022   Status post total replacement of left hip 01/22/2022   High blood  cholesterol 11/06/2021   Insomnia 11/06/2021   Diabetes mellitus without complication (HCC) 07/16/2019   Essential hypertension 07/16/2019   Primary osteoarthritis involving multiple joints 07/16/2019   Screening for prostate cancer 07/16/2019   Obstructive sleep apnea (adult) (pediatric) 02/26/2019   Morbid (severe) obesity due to excess calories (HCC) 02/26/2019   Cervical spondylosis 02/02/2019   Lumbar spondylosis 02/02/2019   Depression, unspecified 02/02/2019   Dyspnea on exertion 02/02/2019   Hand pain 02/02/2019   Chest pain with high risk for cardiac etiology 02/02/2019   Hip pain 02/02/2019   Knee pain 02/02/2019   Localized, primary osteoarthritis 02/02/2019   Low back pain 02/02/2019   Neck pain 02/02/2019   Primary osteoarthritis of right knee 02/02/2019   Past Medical History:  Diagnosis Date   Back pain    lower back   COPD (chronic obstructive pulmonary disease) (HCC)    Dependent on wheelchair    Depression    pt denies   Diabetes mellitus without complication (HCC)    type 2   HLD (hyperlipidemia)    Hypertension    Insomnia    Osteoarthritis of left hip    Seasonal allergies    Sleep apnea    uses CPAP nightly   Spondylolysis of cervical region    Spondylolysis of lumbar region     No family history on file.  Past  Surgical History:  Procedure Laterality Date   BLEPHAROPLASTY Bilateral    CARDIAC CATHETERIZATION  2003   COLONOSCOPY     several x 3   JOINT REPLACEMENT  2015   left knee   PALATE SURGERY  2008   benign   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   Procedure: LEFT TOTAL HIP ARTHROPLASTY ANTERIOR APPROACH;  Surgeon: Jerri Kay HERO, MD;  Location: [facility];  Service: Orthopedics;  Laterality: Left;  3-C   TOTAL HIP ARTHROPLASTY Right 05/03/2022   Procedure: RIGHT TOTAL HIP ARTHROPLASTY ANTERIOR APPROACH;  Surgeon: Jerri Kay HERO, MD;  Location: [facility];  Service: Orthopedics;  Laterality: Right;   TOTAL KNEE ARTHROPLASTY Right 07/13/2024    Procedure: ARTHROPLASTY, KNEE, TOTAL;  Surgeon: Jerri Kay HERO, MD;  Location: [facility];  Service: Orthopedics;  Laterality: Right;   UMBILICAL HERNIA REPAIR  01/2024   UPPER GI ENDOSCOPY     several - stomach ulcers   Social History   Occupational History   Not on file  Tobacco Use   Smoking status: Former    Current packs/day: 0.00    Average packs/day: 1 pack/day for 20.0 years (20.0 ttl pk-yrs)    Types: Cigarettes    Start date: 08/24/1971    Quit date: 08/24/1991    Years since quitting: 33.0   Smokeless tobacco: Never  Vaping Use   Vaping status: Never Used  Substance and Sexual Activity   Alcohol use: Not Currently   Drug use: Never   Sexual activity: Not Currently

## 2024-10-05 ENCOUNTER — Encounter: Payer: Self-pay | Admitting: Radiology

## 2024-10-15 ENCOUNTER — Ambulatory Visit (INDEPENDENT_AMBULATORY_CARE_PROVIDER_SITE_OTHER): Admitting: Orthopaedic Surgery

## 2024-10-15 DIAGNOSIS — Z96651 Presence of right artificial knee joint: Secondary | ICD-10-CM

## 2024-10-15 NOTE — Progress Notes (Signed)
 Post-Op Visit Note   Patient: Kent Rodriguez           Date of Birth: August 01, 1946           MRN: [id number] Visit Date: 10/15/2024 PCP: Zita Reges, MD   Assessment & Plan:  Chief Complaint:  Chief Complaint  Patient presents with   Right Knee - Follow-up    Right TKA 07/13/2024   Visit Diagnoses:  1. Status post total right knee replacement     Plan: Patient is 3 months status post right total knee replacement.  He is doing well overall.  Examination shows fully healed surgical scar.  Functional range of motion.  He is able to walk without any assistive devices.  [name] has done an incredible job recovering from his various surgeries.  He has now been able to lose weight and get out of a motorized wheelchair.  He is going to continue to work on getting stronger and losing weight.  Dental prophylaxis reinforced.  Recheck in 3 months with repeat x-rays.  Follow-Up Instructions: Return in about 3 months (around 01/15/2025).   Orders:  No orders of the defined types were placed in this encounter.  No orders of the defined types were placed in this encounter.   Imaging: No results found.  PMFS History: Patient Active Problem List   Diagnosis Date Noted   Status post total right knee replacement 07/13/2024   Status post total replacement of right hip 05/03/2022   Primary osteoarthritis of right hip 03/06/2022   Primary osteoarthritis of left hip 01/22/2022   Status post total replacement of left hip 01/22/2022   High blood cholesterol 11/06/2021   Insomnia 11/06/2021   Diabetes mellitus without complication (HCC) 07/16/2019   Essential hypertension 07/16/2019   Primary osteoarthritis involving multiple joints 07/16/2019   Screening for prostate cancer 07/16/2019   Obstructive sleep apnea (adult) (pediatric) 02/26/2019   Morbid (severe) obesity due to excess calories (HCC) 02/26/2019   Cervical spondylosis 02/02/2019   Lumbar spondylosis 02/02/2019    Depression, unspecified 02/02/2019   Dyspnea on exertion 02/02/2019   Hand pain 02/02/2019   Chest pain with high risk for cardiac etiology 02/02/2019   Hip pain 02/02/2019   Knee pain 02/02/2019   Localized, primary osteoarthritis 02/02/2019   Low back pain 02/02/2019   Neck pain 02/02/2019   Primary osteoarthritis of right knee 02/02/2019   Past Medical History:  Diagnosis Date   Back pain    lower back   COPD (chronic obstructive pulmonary disease) (HCC)    Dependent on wheelchair    Depression    pt denies   Diabetes mellitus without complication (HCC)    type 2   HLD (hyperlipidemia)    Hypertension    Insomnia    Osteoarthritis of left hip    Seasonal allergies    Sleep apnea    uses CPAP nightly   Spondylolysis of cervical region    Spondylolysis of lumbar region     No family history on file.  Past Surgical History:  Procedure Laterality Date   BLEPHAROPLASTY Bilateral    CARDIAC CATHETERIZATION  2003   COLONOSCOPY     several x 3   JOINT REPLACEMENT  2015   left knee   PALATE SURGERY  2008   benign   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   Procedure: LEFT TOTAL HIP ARTHROPLASTY ANTERIOR APPROACH;  Surgeon: Jerri Kay HERO, MD;  Location: [facility];  Service: Orthopedics;  Laterality: Left;  3-C  TOTAL HIP ARTHROPLASTY Right 05/03/2022   Procedure: RIGHT TOTAL HIP ARTHROPLASTY ANTERIOR APPROACH;  Surgeon: Jerri Kay HERO, MD;  Location: [facility];  Service: Orthopedics;  Laterality: Right;   TOTAL KNEE ARTHROPLASTY Right 07/13/2024   Procedure: ARTHROPLASTY, KNEE, TOTAL;  Surgeon: Jerri Kay HERO, MD;  Location: [facility];  Service: Orthopedics;  Laterality: Right;   UMBILICAL HERNIA REPAIR  01/2024   UPPER GI ENDOSCOPY     several - stomach ulcers   Social History   Occupational History   Not on file  Tobacco Use   Smoking status: Former    Current packs/day: 0.00    Average packs/day: 1 pack/day for 20.0 years (20.0 ttl pk-yrs)    Types: Cigarettes    Start date:  08/24/1971    Quit date: 08/24/1991    Years since quitting: 33.1   Smokeless tobacco: Never  Vaping Use   Vaping status: Never Used  Substance and Sexual Activity   Alcohol use: Not Currently   Drug use: Never   Sexual activity: Not Currently

## 2025-01-12 ENCOUNTER — Ambulatory Visit: Admitting: Orthopaedic Surgery
